# Patient Record
Sex: Male | Born: 1955
Health system: Southern US, Community
[De-identification: ages and names within clinical notes are randomized; demographics above are authoritative.]

## PROBLEM LIST (undated history)

## (undated) DIAGNOSIS — E119 Type 2 diabetes mellitus without complications: Secondary | ICD-10-CM

## (undated) HISTORY — PX: TONSILLECTOMY: SUR1361

## (undated) HISTORY — PX: CATARACT EXTRACTION: SUR2

## (undated) HISTORY — DX: Type 2 diabetes mellitus without complications: E11.9

---

## 1999-04-23 ENCOUNTER — Encounter: Admission: RE | Admit: 1999-04-23 | Discharge: 1999-07-22 | Payer: Self-pay | Admitting: Unknown Physician Specialty

## 2004-07-22 ENCOUNTER — Ambulatory Visit: Payer: Self-pay | Admitting: Endocrinology

## 2009-03-31 HISTORY — PX: SHOULDER ARTHROSCOPY: SHX128

## 2015-02-28 ENCOUNTER — Encounter: Payer: Self-pay | Admitting: Cardiology

## 2015-03-29 ENCOUNTER — Ambulatory Visit (INDEPENDENT_AMBULATORY_CARE_PROVIDER_SITE_OTHER): Payer: 59 | Admitting: Family Medicine

## 2015-03-29 VITALS — BP 108/70 | HR 75 | Temp 98.5°F | Resp 18 | Ht 76.0 in | Wt 255.0 lb

## 2015-03-29 DIAGNOSIS — E785 Hyperlipidemia, unspecified: Secondary | ICD-10-CM | POA: Diagnosis not present

## 2015-03-29 DIAGNOSIS — E119 Type 2 diabetes mellitus without complications: Secondary | ICD-10-CM | POA: Diagnosis not present

## 2015-03-29 DIAGNOSIS — I152 Hypertension secondary to endocrine disorders: Secondary | ICD-10-CM | POA: Insufficient documentation

## 2015-03-29 DIAGNOSIS — I10 Essential (primary) hypertension: Secondary | ICD-10-CM | POA: Insufficient documentation

## 2015-03-29 DIAGNOSIS — E1169 Type 2 diabetes mellitus with other specified complication: Secondary | ICD-10-CM | POA: Insufficient documentation

## 2015-03-29 LAB — COMPLETE METABOLIC PANEL WITH GFR
ALK PHOS: 78 U/L (ref 40–115)
ALT: 27 U/L (ref 9–46)
AST: 18 U/L (ref 10–35)
Albumin: 4.7 g/dL (ref 3.6–5.1)
BILIRUBIN TOTAL: 0.9 mg/dL (ref 0.2–1.2)
BUN: 12 mg/dL (ref 7–25)
CALCIUM: 9.9 mg/dL (ref 8.6–10.3)
CO2: 25 mmol/L (ref 20–31)
CREATININE: 0.78 mg/dL (ref 0.70–1.25)
Chloride: 102 mmol/L (ref 98–110)
Glucose, Bld: 98 mg/dL (ref 65–99)
Potassium: 4.1 mmol/L (ref 3.5–5.3)
Sodium: 138 mmol/L (ref 135–146)
TOTAL PROTEIN: 7.5 g/dL (ref 6.1–8.1)

## 2015-03-29 LAB — LIPID PANEL
CHOLESTEROL: 174 mg/dL (ref 125–200)
HDL: 39 mg/dL — AB (ref >=40)
LDL Cholesterol: 102 mg/dL (ref <130)
TRIGLYCERIDES: 164 mg/dL — AB (ref <150)
Total CHOL/HDL Ratio: 4.5 Ratio (ref <=5.0)
VLDL: 33 mg/dL — ABNORMAL HIGH (ref <30)

## 2015-03-29 LAB — POCT GLYCOSYLATED HEMOGLOBIN (HGB A1C): Hemoglobin A1C: 6.9

## 2015-03-29 LAB — GLUCOSE, POCT (MANUAL RESULT ENTRY): POC Glucose: 100 mg/dl — AB (ref 70–99)

## 2015-03-29 MED ORDER — METFORMIN HCL 500 MG PO TABS: 500.0000 mg | ORAL_TABLET | Freq: Two times a day (BID) | ORAL | Status: DC

## 2015-03-29 MED ORDER — QUINAPRIL HCL 20 MG PO TABS: 20.0000 mg | ORAL_TABLET | Freq: Every day | ORAL | Status: DC

## 2015-03-29 MED ORDER — CANAGLIFLOZIN 100 MG PO TABS: 100.0000 mg | ORAL_TABLET | Freq: Every day | ORAL | Status: DC

## 2015-03-29 MED ORDER — GLIMEPIRIDE 4 MG PO TABS: 4.0000 mg | ORAL_TABLET | Freq: Every day | ORAL | Status: DC

## 2015-03-29 MED ORDER — LOVASTATIN 40 MG PO TABS: 40.0000 mg | ORAL_TABLET | Freq: Every day | ORAL | Status: DC

## 2015-03-29 NOTE — Progress Notes (Addendum)
Subjective:    Patient ID: Christian Harrington, male    DOB: [birth year], 59 y.o.   MRN: [id number] By signing my name below, I, Judithe Modest, attest that this documentation has been prepared under the direction and in the presence of Merri Ray, MD. Electronically Signed: Judithe Modest, ER Scribe. 03/29/2015. 5:13 PM.  Chief Complaint  Patient presents with  . Medication Refill    Invokana,     HPI HPI Comments: Christian Harrington is a 59 y.o. male with a past hx of HLD and DM who presents to [facility] reporting for a medication refill. His previous PCP was Dr. Deeann Saint with [facility]. Pt has not been diagnosed with HTN, pt takes an ace inhibitor for kidney protection. He has no complications due to his DM. He has had intermittent low blood pressure sx, but no recent sx because he eats regularly. He was initially diagnosed with DM in 2001. He exercised infrequently but when he does. He quit smoking in 1993, and had a 25 year smoking hx 2.5 PPD. He has never taken insulin.   Dentist: He has not seen a dentist in a couple of years.  DM: Takes amoril 78m, QD.  invocana 1038mQD. Metformin 100066mHLD: Lovistatin 67m37m Aspirin 325, every other day.  Ace Inhibitor: Accupril 20mg14m Patient Active Problem List   Diagnosis Date Noted  . Diabetes mellitus (HCC) [place]29/2016  . HLD (hyperlipidemia) 03/29/2015  . BP (high blood pressure) 03/29/2015   Past Medical History  Diagnosis Date  . Diabetes mellitus [place] complication (HCC) [place] No pertinent past surgical history. No Known Allergies Prior to Admission medications   Medication Sig Start Date End Date Taking? Authorizing Provider  aspirin 325 MG tablet Take 325 mg by mouth every other day.    Yes Historical Provider, MD  canagliflozin (INVOKANA) 100 MG TABS tablet Take 100 mg by mouth. 09/07/14  Yes Historical Provider, MD  Cholecalciferol (VITAMIN D3) 400 units CAPS Take 500 Units by mouth.   Yes  Historical Provider, MD  Cinnamon 500 MG TABS Take 1 tablet by mouth.   Yes Historical Provider, MD  Forskolin POWD by Does not apply route.   Yes Historical Provider, MD  Garcinia Cambogia-Chromium 500-200 MG-MCG TABS Take 1 tablet by mouth.   Yes Historical Provider, MD  glimepiride (AMARYL) 4 MG tablet Take 4 mg by mouth. 11/12/14  Yes Historical Provider, MD  lovastatin (MEVACOR) 40 MG tablet Take 40 mg by mouth. 09/07/14  Yes Historical Provider, MD  metFORMIN (GLUCOPHAGE-XR) 500 MG 24 hr tablet Take 1,000 mg by mouth. 11/12/14  Yes Historical Provider, MD  naproxen (NAPROSYN) 500 MG tablet Take 500 mg by mouth. 09/07/14  Yes Historical Provider, MD  Omega-3 Fatty Acids (FISH OIL) 1000 MG CAPS Take 1 capsule by mouth.   Yes Historical Provider, MD  quinapril (ACCUPRIL) 20 MG tablet Take 20 mg by mouth. 09/07/14  Yes Historical Provider, MD   Social History   Social History  . Marital Status: Married    Spouse Name: N/A  . Number of Children: N/A  . Years of Education: N/A   Occupational History  . Not on file.   Social History Main Topics  . Smoking status: Former Smoker    Quit date: 04/01/1991  . Smokeless tobacco: Not on file  . Alcohol Use: No  . Drug Use: No  . Sexual Activity: Not on file   Other Topics Concern  .  Not on file   Social History Narrative  . No narrative on file    Review of Systems  Constitutional: Negative for fever, chills, fatigue and unexpected weight change.  Eyes: Negative for visual disturbance.  Respiratory: Negative for cough, chest tightness and shortness of breath.   Cardiovascular: Negative for chest pain, palpitations and leg swelling.  Gastrointestinal: Negative for abdominal pain and blood in stool.  Genitourinary: Negative for urgency and frequency.  Neurological: Negative for dizziness, light-headedness and headaches.      Objective:  BP 108/70 mmHg  Pulse 75  Temp(Src) 98.5 F (36.9 C) (Oral)  Resp 18  Ht 6' 4" (1.93 m)  Wt 255  lb (115.667 kg)  BMI 31.05 kg/m2  SpO2 96%  Physical Exam  Constitutional: He is oriented to person, place, and time. He appears well-developed and well-nourished. No distress.  HENT:  Head: Normocephalic and atraumatic.  Eyes: EOM are normal. Pupils are equal, round, and reactive to light.  Neck: Neck supple. No JVD present. Carotid bruit is not present.  Cardiovascular: Normal rate, regular rhythm and normal heart sounds.   No murmur heard. Pulmonary/Chest: Effort normal and breath sounds normal. No respiratory distress. He has no rales.  Musculoskeletal: Normal range of motion. He exhibits no edema.  Neurological: He is alert and oriented to person, place, and time. Coordination normal.  Skin: Skin is warm and dry. He is not diaphoretic.  Microfilament testing normal bilaterally. There is some callus along medial great toes bilaterally.  Psychiatric: He has a normal mood and affect. His behavior is normal.  Nursing note and vitals reviewed.  Results for orders placed or performed in visit on 03/29/15  POCT glucose (manual entry)  Result Value Ref Range   POC Glucose 100 (A) 70 - 99 mg/dl  POCT glycosylated hemoglobin (Hb A1C)  Result Value Ref Range   Hemoglobin A1C 6.9        Assessment & Plan:   Christian Harrington is a 59 y.o. male Type 2 diabetes mellitus without complication, without long-term current use of insulin ([place]) - Plan: Microalbumin, urine, POCT glucose (manual entry), POCT glycosylated hemoglobin (Hb A1C)  -controlled. Discussed change to invokamet, but chose to stay on same meds. Changed to standard metformin as taking BID.   -urine microalbumin.   -dentist appt recommended.   F/u in 3 months.   Hyperlipidemia - Plan: COMPLETE METABOLIC PANEL WITH GFR, Lipid panel  -cont lovastatin, labs pending.   Plan on cpe with me in next 6-9 months. Can coordinate with work health screening to decrease labwork if needed.   Meds ordered this encounter  Medications  .  aspirin 325 MG tablet    Sig: Take 325 mg by mouth every other day.   [name] DISCONTD: canagliflozin (INVOKANA) 100 MG TABS tablet    Sig: Take 100 mg by mouth.  . Cinnamon 500 MG TABS    Sig: Take 1 tablet by mouth.  . Omega-3 Fatty Acids (FISH OIL) 1000 MG CAPS    Sig: Take 1 capsule by mouth.  Sharia Reeve POWD    Sig: by Does not apply route.  . Garcinia Cambogia-Chromium 500-200 MG-MCG TABS    Sig: Take 1 tablet by mouth.  . DISCONTD: glimepiride (AMARYL) 4 MG tablet    Sig: Take 4 mg by mouth.  . DISCONTD: lovastatin (MEVACOR) 40 MG tablet    Sig: Take 40 mg by mouth.  . DISCONTD: metFORMIN (GLUCOPHAGE-XR) 500 MG 24 hr tablet    Sig:  Take 1,000 mg by mouth.  . naproxen (NAPROSYN) 500 MG tablet    Sig: Take 500 mg by mouth.  . DISCONTD: quinapril (ACCUPRIL) 20 MG tablet    Sig: Take 20 mg by mouth.  . Cholecalciferol (VITAMIN D3) 400 units CAPS    Sig: Take 500 Units by mouth.  [name] glimepiride (AMARYL) 4 MG tablet    Sig: Take 1 tablet (4 mg total) by mouth daily with breakfast.    Dispense:  90 tablet    Refill:  1  . quinapril (ACCUPRIL) 20 MG tablet    Sig: Take 1 tablet (20 mg total) by mouth daily.    Dispense:  90 tablet    Refill:  1  . lovastatin (MEVACOR) 40 MG tablet    Sig: Take 1 tablet (40 mg total) by mouth at bedtime.    Dispense:  90 tablet    Refill:  1  . canagliflozin (INVOKANA) 100 MG TABS tablet    Sig: Take 1 tablet (100 mg total) by mouth daily before breakfast.    Dispense:  90 tablet    Refill:  1  . metFORMIN (GLUCOPHAGE) 500 MG tablet    Sig: Take 1 tablet (500 mg total) by mouth 2 (two) times daily with a meal.    Dispense:  180 tablet    Refill:  1   Patient Instructions  You should receive a call or letter about your lab results within the next week to 10 days.   Follow up in 3 months and see me around the time of your work health screen for physical.   Diabetes and Standards of Medical Care Diabetes is complicated. You may find that your  diabetes team includes a dietitian, nurse, diabetes educator, eye doctor, and more. To help everyone know what is going on and to help you get the care you deserve, the following schedule of care was developed to help keep you on track. Below are the tests, exams, vaccines, medicines, education, and plans you will need. HbA1c test This test shows how well you have controlled your glucose over the past 2-3 months. It is used to see if your diabetes management plan needs to be adjusted.   It is performed at least 2 times a year if you are meeting treatment goals.  It is performed 4 times a year if therapy has changed or if you are not meeting treatment goals. Blood pressure test  This test is performed at every routine medical visit. The goal is less than 140/90 mm Hg for most people, but 130/80 mm Hg in some cases. Ask your health care provider about your goal. Dental exam  Follow up with the dentist regularly. Eye exam  If you are diagnosed with type 1 diabetes as a child, get an exam upon reaching the age of 81 years or older and having had diabetes for 3-5 years. Yearly eye exams are recommended after that initial eye exam.  If you are diagnosed with type 1 diabetes as an adult, get an exam within 5 years of diagnosis and then yearly.  If you are diagnosed with type 2 diabetes, get an exam as soon as possible after the diagnosis and then yearly. Foot care exam  Visual foot exams are performed at every routine medical visit. The exams check for cuts, injuries, or other problems with the feet.  You should have a complete foot exam performed every year. This exam includes an inspection of the structure and skin of your  feet, a check of the pulses in your feet, and a check of the sensation in your feet.  Type 1 diabetes: The first exam is performed 5 years after diagnosis.  Type 2 diabetes: The first exam is performed at the time of diagnosis.  Check your feet nightly for cuts, injuries,  or other problems with your feet. Tell your health care provider if anything is not healing. Kidney function test (urine microalbumin)  This test is performed once a year.  Type 1 diabetes: The first test is performed 5 years after diagnosis.  Type 2 diabetes: The first test is performed at the time of diagnosis.  A serum creatinine and estimated glomerular filtration rate (eGFR) test is done once a year to assess the level of chronic kidney disease (CKD), if present. Lipid profile (cholesterol, HDL, LDL, triglycerides)  Performed every 5 years for most people.  The goal for LDL is less than 100 mg/dL. If you are at high risk, the goal is less than 70 mg/dL.  The goal for HDL is 40 mg/dL-50 mg/dL for men and 50 mg/dL-60 mg/dL for women. An HDL cholesterol of 60 mg/dL or higher gives some protection against heart disease.  The goal for triglycerides is less than 150 mg/dL. Immunizations  The flu (influenza) vaccine is recommended yearly for every person 78 months of age or older who has diabetes.  The pneumonia (pneumococcal) vaccine is recommended for every person 58 years of age or older who has diabetes. Adults 79 years of age or older may receive the pneumonia vaccine as a series of two separate shots.  The hepatitis B vaccine is recommended for adults shortly after they have been diagnosed with diabetes.  The Tdap (tetanus, diphtheria, and pertussis) vaccine should be given:  According to normal childhood vaccination schedules, for children.  Every 10 years, for adults who have diabetes. Diabetes self-management education  Education is recommended at diagnosis and ongoing as needed. Treatment plan  Your treatment plan is reviewed at every medical visit.   This information is not intended to replace advice given to you by your health care provider. Make sure you discuss any questions you have with your health care provider.   Document Released: 01/12/2009 Document Revised:  04/07/2014 Document Reviewed: 08/17/2012 Elsevier Interactive Patient Education [employer].       I personally performed the services described in this documentation, which was scribed in my presence. The recorded information has been reviewed and considered, and addended by me as needed.

## 2015-03-29 NOTE — Patient Instructions (Signed)
You should receive a call or letter about your lab results within the next week to 10 days.   Follow up in 3 months and see me around the time of your work health screen for physical.   Diabetes and Standards of Medical Care Diabetes is complicated. You may find that your diabetes team includes a dietitian, nurse, diabetes educator, eye doctor, and more. To help everyone know what is going on and to help you get the care you deserve, the following schedule of care was developed to help keep you on track. Below are the tests, exams, vaccines, medicines, education, and plans you will need. HbA1c test This test shows how well you have controlled your glucose over the past 2-3 months. It is used to see if your diabetes management plan needs to be adjusted.   It is performed at least 2 times a year if you are meeting treatment goals.  It is performed 4 times a year if therapy has changed or if you are not meeting treatment goals. Blood pressure test  This test is performed at every routine medical visit. The goal is less than 140/90 mm Hg for most people, but 130/80 mm Hg in some cases. Ask your health care provider about your goal. Dental exam  Follow up with the dentist regularly. Eye exam  If you are diagnosed with type 1 diabetes as a child, get an exam upon reaching the age of 67 years or older and having had diabetes for 3-5 years. Yearly eye exams are recommended after that initial eye exam.  If you are diagnosed with type 1 diabetes as an adult, get an exam within 5 years of diagnosis and then yearly.  If you are diagnosed with type 2 diabetes, get an exam as soon as possible after the diagnosis and then yearly. Foot care exam  Visual foot exams are performed at every routine medical visit. The exams check for cuts, injuries, or other problems with the feet.  You should have a complete foot exam performed every year. This exam includes an inspection of the structure and skin of your  feet, a check of the pulses in your feet, and a check of the sensation in your feet.  Type 1 diabetes: The first exam is performed 5 years after diagnosis.  Type 2 diabetes: The first exam is performed at the time of diagnosis.  Check your feet nightly for cuts, injuries, or other problems with your feet. Tell your health care provider if anything is not healing. Kidney function test (urine microalbumin)  This test is performed once a year.  Type 1 diabetes: The first test is performed 5 years after diagnosis.  Type 2 diabetes: The first test is performed at the time of diagnosis.  A serum creatinine and estimated glomerular filtration rate (eGFR) test is done once a year to assess the level of chronic kidney disease (CKD), if present. Lipid profile (cholesterol, HDL, LDL, triglycerides)  Performed every 5 years for most people.  The goal for LDL is less than 100 mg/dL. If you are at high risk, the goal is less than 70 mg/dL.  The goal for HDL is 40 mg/dL-50 mg/dL for men and 50 mg/dL-60 mg/dL for women. An HDL cholesterol of 60 mg/dL or higher gives some protection against heart disease.  The goal for triglycerides is less than 150 mg/dL. Immunizations  The flu (influenza) vaccine is recommended yearly for every person 17 months of age or older who has diabetes.  The  pneumonia (pneumococcal) vaccine is recommended for every person 55 years of age or older who has diabetes. Adults 103 years of age or older may receive the pneumonia vaccine as a series of two separate shots.  The hepatitis B vaccine is recommended for adults shortly after they have been diagnosed with diabetes.  The Tdap (tetanus, diphtheria, and pertussis) vaccine should be given:  According to normal childhood vaccination schedules, for children.  Every 10 years, for adults who have diabetes. Diabetes self-management education  Education is recommended at diagnosis and ongoing as needed. Treatment plan  Your  treatment plan is reviewed at every medical visit.   This information is not intended to replace advice given to you by your health care provider. Make sure you discuss any questions you have with your health care provider.   Document Released: 01/12/2009 Document Revised: 04/07/2014 Document Reviewed: 08/17/2012 Elsevier Interactive Patient Education [employer].

## 2015-03-30 LAB — MICROALBUMIN, URINE: Microalb, Ur: 4.7 mg/dL

## 2015-07-21 ENCOUNTER — Ambulatory Visit (INDEPENDENT_AMBULATORY_CARE_PROVIDER_SITE_OTHER): Payer: 59 | Admitting: Physician Assistant

## 2015-07-21 VITALS — BP 128/70 | HR 76 | Temp 97.8°F | Resp 16 | Ht 76.0 in | Wt 253.6 lb

## 2015-07-21 DIAGNOSIS — E119 Type 2 diabetes mellitus without complications: Secondary | ICD-10-CM | POA: Diagnosis not present

## 2015-07-21 LAB — POCT GLYCOSYLATED HEMOGLOBIN (HGB A1C): HEMOGLOBIN A1C: 6.8

## 2015-07-21 LAB — GLUCOSE, POCT (MANUAL RESULT ENTRY): POC GLUCOSE: 87 mg/dL (ref 70–99)

## 2015-07-21 MED ORDER — QUINAPRIL HCL 20 MG PO TABS: 20.0000 mg | ORAL_TABLET | Freq: Every day | ORAL | Status: DC

## 2015-07-21 MED ORDER — LOVASTATIN 40 MG PO TABS: 40.0000 mg | ORAL_TABLET | Freq: Every day | ORAL | Status: DC

## 2015-07-21 MED ORDER — GLUCOSE BLOOD VI STRP: ORAL_STRIP | Status: DC

## 2015-07-21 MED ORDER — METFORMIN HCL 500 MG PO TABS: 1000.0000 mg | ORAL_TABLET | Freq: Two times a day (BID) | ORAL | Status: DC

## 2015-07-21 MED ORDER — CANAGLIFLOZIN 100 MG PO TABS: 100.0000 mg | ORAL_TABLET | Freq: Every day | ORAL | Status: DC

## 2015-07-21 NOTE — Progress Notes (Signed)
Patient ID: Christian Harrington, male    DOB: 10/07/1955, 60 y.o.   MRN: [id number]  PCP: No primary care provider on file.  Subjective:   Chief Complaint  Patient presents with  . Medication Refill    Inovokana,glimepride,lovastatin,metformin,quinapril    HPI Presents for evaluation of diabetes.  Last visit was 03/2015. Labs at that time included lipids and urine microalbumin.  Notes that at his last visit, the plan was to change the XR metformin to the IR, but that did not happen. Review of the chart indicates that he did get the immediate release product.  Previously on metformin 500 mg BID and Actos. When Actos was stopped, the metformin was increased to 1000 mg and glimepiride was added. He's hopeful to stop the glimepiride, and continue 500 mg BID.  Has not seen an eye specialist in the past 12 months. He became upset by the bills he kept receiving from the office.   Review of Systems  Constitutional: Negative.   Eyes: Negative for visual disturbance.  Respiratory: Negative for chest tightness and shortness of breath.   Cardiovascular: Negative for chest pain, palpitations and leg swelling.  Gastrointestinal: Negative for nausea, vomiting, diarrhea and constipation.  Endocrine: Negative for cold intolerance, heat intolerance, polydipsia, polyphagia and polyuria.  Genitourinary: Negative for dysuria, urgency, frequency and hematuria.  Musculoskeletal: Positive for arthralgias (LEFT shoulder). Negative for myalgias and joint swelling.  Skin: Negative for rash.  Neurological: Negative for dizziness, weakness and headaches.  Hematological: Negative for adenopathy.       Patient Active Problem List   Diagnosis Date Noted  . Diabetes mellitus (HCC) 03/29/2015  . HLD (hyperlipidemia) 03/29/2015  . BP (high blood pressure) 03/29/2015     Prior to Admission medications   Medication Sig Start Date End Date Taking? Authorizing Provider  aspirin 325 MG tablet Take 325 mg  by mouth every other day.    Yes Historical Provider, MD  canagliflozin (INVOKANA) 100 MG TABS tablet Take 1 tablet (100 mg total) by mouth daily before breakfast. 03/29/15  Yes Shade FloodJeffrey R Greene, MD  Cholecalciferol (VITAMIN D3) 400 units CAPS Take 500 Units by mouth.   Yes Historical Provider, MD  Cinnamon 500 MG TABS Take 1 tablet by mouth.   Yes Historical Provider, MD  Forskolin POWD by Does not apply route.   Yes Historical Provider, MD  Garcinia Cambogia-Chromium 500-200 MG-MCG TABS Take 1 tablet by mouth.   Yes Historical Provider, MD  glimepiride (AMARYL) 4 MG tablet Take 1 tablet (4 mg total) by mouth daily with breakfast. 03/29/15  Yes Shade FloodJeffrey R Greene, MD  lovastatin (MEVACOR) 40 MG tablet Take 1 tablet (40 mg total) by mouth at bedtime. 03/29/15  Yes Shade FloodJeffrey R Greene, MD  metFORMIN (GLUCOPHAGE) 500 MG tablet Take 1 tablet (500 mg total) by mouth 2 (two) times daily with a meal. 03/29/15  Yes Shade FloodJeffrey R Greene, MD  naproxen (NAPROSYN) 500 MG tablet Take 500 mg by mouth. 09/07/14  Yes Historical Provider, MD  Omega-3 Fatty Acids (FISH OIL) 1000 MG CAPS Take 1 capsule by mouth.   Yes Historical Provider, MD  quinapril (ACCUPRIL) 20 MG tablet Take 1 tablet (20 mg total) by mouth daily. 03/29/15  Yes Shade FloodJeffrey R Greene, MD     No Known Allergies     Objective:  Physical Exam  Constitutional: He is oriented to person, place, and time. He appears well-developed and well-nourished. He is active and cooperative. No distress.  BP 128/70 mmHg  Pulse 76  Temp(Src) 97.8 F (36.6 C) (Oral)  Resp 16  Ht  (1.93 m)  Wt 253 lb 9.6 oz (115.032 kg)  BMI 30.88 kg/m2  SpO2 97%  HENT:  Head: Normocephalic and atraumatic.  Right Ear: Hearing normal.  Left Ear: Hearing normal.  Eyes: Conjunctivae are normal. No scleral icterus.  Neck: Normal range of motion, full passive range of motion without pain and phonation normal. Neck supple. No thyromegaly present.  Cardiovascular: Normal rate,  regular rhythm and normal heart sounds.   Pulses:      Radial pulses are 2+ on the right side, and 2+ on the left side.  Pulmonary/Chest: Effort normal and breath sounds normal.  Lymphadenopathy:       Head (right side): No tonsillar, no preauricular, no posterior auricular and no occipital adenopathy present.       Head (left side): No tonsillar, no preauricular, no posterior auricular and no occipital adenopathy present.    He has no cervical adenopathy.       Right: No supraclavicular adenopathy present.       Left: No supraclavicular adenopathy present.  Neurological: He is alert and oriented to person, place, and time. No sensory deficit.  Skin: Skin is warm, dry and intact. No rash noted. No cyanosis or erythema. Nails show no clubbing.  Psychiatric: He has a normal mood and affect. His speech is normal and behavior is normal.     Results for orders placed or performed in visit on 07/21/15  POCT glucose (manual entry)  Result Value Ref Range   POC Glucose 87 70 - 99 mg/dl  POCT glycosylated hemoglobin (Hb A1C)  Result Value Ref Range   Hemoglobin A1C 6.8          Assessment & Plan:   1. Type 2 diabetes mellitus without complication, without long-term current use of insulin (HCC) I recommend D/C glimepiride and increase metformin to 1000 mg BID. He indicates that he will stop the glimepiride, but continue on the lower dose of metformin. He is interested in finding out if [place] or [place] are lower cost for him. Request his records from previous PCP, to identify which of his outstanding health maintenance items truly need updating (he says that he has had "most of those"). - POCT glucose (manual entry) - POCT glycosylated hemoglobin (Hb A1C) - Comprehensive metabolic panel - quinapril (ACCUPRIL) 20 MG tablet; Take 1 tablet (20 mg total) by mouth daily.  Dispense: 90 tablet; Refill: 3 - metFORMIN (GLUCOPHAGE) 500 MG tablet; Take 2 tablets (1,000 mg total) by mouth 2 (two)  times daily with a meal.  Dispense: 360 tablet; Refill: 3 - lovastatin (MEVACOR) 40 MG tablet; Take 1 tablet (40 mg total) by mouth at bedtime.  Dispense: 90 tablet; Refill: 3 - canagliflozin (INVOKANA) 100 MG TABS tablet; Take 1 tablet (100 mg total) by mouth daily before breakfast.  Dispense: 90 tablet; Refill: 3 - glucose blood test strip; Use as instructed  Dispense: 100 each; Refill: 12   Fernande Bras, PA-C Physician Assistant-Certified [facility]

## 2015-07-21 NOTE — Patient Instructions (Addendum)
Other medications like Invokana: Jardiance (Boe[employer]ringer [employer]gel[employer]) English as a second language teacherarxiga (Astra[employer]en[employer])  I recommend that you increase the metformin to 1000 mg twice each day, as I am concerned that the A1C will rise above 7% with the elimination of the glimepiride.  I have provided refills of the medications to last for a year. However, I recommend that you return for re-evaluation in 3-4 months.  I recommend that at your next visit, you have a complete physical and address a number of outstanding health maintenance items, including vaccines and additional recommended screening lab testing.   IF you received an x-ray today, you will receive an invoice from [facility]. Please contact [facility] at [phone] with questions or concerns regarding your invoice.   IF you received labwork today, you will receive an invoice from [facility]. Please contact [facility] at [phone] with questions or concerns regarding your invoice.   Our billing staff will not be able to assist you with questions regarding bills from these companies.  You will be contacted with the lab results as soon as they are available. The fastest way to get your results is to activate your My Chart account. Instructions are located on the last page of this paperwork. If you have not heard from us regarding the results in 2 weeks, please contact this office.

## 2015-07-22 ENCOUNTER — Encounter: Payer: Self-pay | Admitting: Physician Assistant

## 2015-07-22 LAB — COMPREHENSIVE METABOLIC PANEL
ALT: 20 U/L (ref 9–46)
AST: 15 U/L (ref 10–35)
Albumin: 4.2 g/dL (ref 3.6–5.1)
Alkaline Phosphatase: 72 U/L (ref 40–115)
BUN: 14 mg/dL (ref 7–25)
CHLORIDE: 103 mmol/L (ref 98–110)
CO2: 27 mmol/L (ref 20–31)
CREATININE: 0.86 mg/dL (ref 0.70–1.25)
Calcium: 9.6 mg/dL (ref 8.6–10.3)
GLUCOSE: 77 mg/dL (ref 65–99)
POTASSIUM: 4.5 mmol/L (ref 3.5–5.3)
SODIUM: 137 mmol/L (ref 135–146)
TOTAL PROTEIN: 7.2 g/dL (ref 6.1–8.1)
Total Bilirubin: 0.5 mg/dL (ref 0.2–1.2)

## 2015-10-02 ENCOUNTER — Encounter: Payer: Self-pay | Admitting: Physician Assistant

## 2016-04-01 DIAGNOSIS — S43432D Superior glenoid labrum lesion of left shoulder, subsequent encounter: Secondary | ICD-10-CM | POA: Diagnosis not present

## 2016-04-03 DIAGNOSIS — S43432D Superior glenoid labrum lesion of left shoulder, subsequent encounter: Secondary | ICD-10-CM | POA: Diagnosis not present

## 2016-04-08 ENCOUNTER — Telehealth: Payer: Self-pay

## 2016-04-08 DIAGNOSIS — S43432D Superior glenoid labrum lesion of left shoulder, subsequent encounter: Secondary | ICD-10-CM | POA: Diagnosis not present

## 2016-04-08 NOTE — Telephone Encounter (Signed)
invokana is back ordered/not available  can pharmacy sub this med for patient?

## 2016-04-08 NOTE — Telephone Encounter (Signed)
Yes. Sub FARXIGA 5 mg 1 PO QD #90

## 2016-04-09 MED ORDER — DAPAGLIFLOZIN PROPANEDIOL 5 MG PO TABS: 5.0000 mg | ORAL_TABLET | Freq: Every day | ORAL | 0 refills | Status: DC

## 2016-04-09 NOTE — Telephone Encounter (Signed)
done

## 2016-04-15 DIAGNOSIS — S43432D Superior glenoid labrum lesion of left shoulder, subsequent encounter: Secondary | ICD-10-CM | POA: Diagnosis not present

## 2016-04-22 DIAGNOSIS — S43432D Superior glenoid labrum lesion of left shoulder, subsequent encounter: Secondary | ICD-10-CM | POA: Diagnosis not present

## 2016-04-24 DIAGNOSIS — S43432D Superior glenoid labrum lesion of left shoulder, subsequent encounter: Secondary | ICD-10-CM | POA: Diagnosis not present

## 2016-04-29 DIAGNOSIS — S43432D Superior glenoid labrum lesion of left shoulder, subsequent encounter: Secondary | ICD-10-CM | POA: Diagnosis not present

## 2016-05-01 DIAGNOSIS — S43432D Superior glenoid labrum lesion of left shoulder, subsequent encounter: Secondary | ICD-10-CM | POA: Diagnosis not present

## 2016-05-06 DIAGNOSIS — S43432D Superior glenoid labrum lesion of left shoulder, subsequent encounter: Secondary | ICD-10-CM | POA: Diagnosis not present

## 2016-05-09 DIAGNOSIS — S43432D Superior glenoid labrum lesion of left shoulder, subsequent encounter: Secondary | ICD-10-CM | POA: Diagnosis not present

## 2016-05-12 DIAGNOSIS — S43432D Superior glenoid labrum lesion of left shoulder, subsequent encounter: Secondary | ICD-10-CM | POA: Diagnosis not present

## 2016-05-13 DIAGNOSIS — S43432D Superior glenoid labrum lesion of left shoulder, subsequent encounter: Secondary | ICD-10-CM | POA: Diagnosis not present

## 2016-05-15 DIAGNOSIS — S43432D Superior glenoid labrum lesion of left shoulder, subsequent encounter: Secondary | ICD-10-CM | POA: Diagnosis not present

## 2016-05-20 DIAGNOSIS — S43432D Superior glenoid labrum lesion of left shoulder, subsequent encounter: Secondary | ICD-10-CM | POA: Diagnosis not present

## 2016-05-22 DIAGNOSIS — S43432D Superior glenoid labrum lesion of left shoulder, subsequent encounter: Secondary | ICD-10-CM | POA: Diagnosis not present

## 2016-05-27 DIAGNOSIS — S43432D Superior glenoid labrum lesion of left shoulder, subsequent encounter: Secondary | ICD-10-CM | POA: Diagnosis not present

## 2016-05-29 DIAGNOSIS — S43432D Superior glenoid labrum lesion of left shoulder, subsequent encounter: Secondary | ICD-10-CM | POA: Diagnosis not present

## 2016-06-03 DIAGNOSIS — S43432D Superior glenoid labrum lesion of left shoulder, subsequent encounter: Secondary | ICD-10-CM | POA: Diagnosis not present

## 2016-06-05 DIAGNOSIS — S43432D Superior glenoid labrum lesion of left shoulder, subsequent encounter: Secondary | ICD-10-CM | POA: Diagnosis not present

## 2016-06-11 ENCOUNTER — Ambulatory Visit (INDEPENDENT_AMBULATORY_CARE_PROVIDER_SITE_OTHER): Payer: 59 | Admitting: Family Medicine

## 2016-06-11 ENCOUNTER — Telehealth: Payer: Self-pay | Admitting: *Deleted

## 2016-06-11 VITALS — BP 122/72 | HR 94 | Temp 98.2°F | Resp 17 | Ht 74.5 in | Wt 251.0 lb

## 2016-06-11 DIAGNOSIS — E119 Type 2 diabetes mellitus without complications: Secondary | ICD-10-CM | POA: Diagnosis not present

## 2016-06-11 LAB — POCT URINALYSIS DIP (MANUAL ENTRY)
BILIRUBIN UA: NEGATIVE
BILIRUBIN UA: NEGATIVE
Leukocytes, UA: NEGATIVE
NITRITE UA: NEGATIVE
PH UA: 5.5
Protein Ur, POC: NEGATIVE
RBC UA: NEGATIVE
Spec Grav, UA: 1.015
Urobilinogen, UA: 0.2

## 2016-06-11 LAB — POCT GLYCOSYLATED HEMOGLOBIN (HGB A1C): HEMOGLOBIN A1C: 8.5

## 2016-06-11 MED ORDER — CANAGLIFLOZIN 100 MG PO TABS: 100.0000 mg | ORAL_TABLET | Freq: Every day | ORAL | 3 refills | Status: DC

## 2016-06-11 MED ORDER — METFORMIN HCL 500 MG PO TABS: 1000.0000 mg | ORAL_TABLET | Freq: Two times a day (BID) | ORAL | 3 refills | Status: DC

## 2016-06-11 MED ORDER — LOVASTATIN 40 MG PO TABS: 40.0000 mg | ORAL_TABLET | Freq: Every day | ORAL | 3 refills | Status: DC

## 2016-06-11 MED ORDER — QUINAPRIL HCL 20 MG PO TABS: 20.0000 mg | ORAL_TABLET | Freq: Every day | ORAL | 3 refills | Status: DC

## 2016-06-11 MED ORDER — BLOOD GLUCOSE MONITOR KIT: PACK | 0 refills | Status: AC

## 2016-06-11 NOTE — Telephone Encounter (Signed)
Faxed Rx for glucose meter kit and supplies Kit, per Dr Mingo Amber. Confirmation page received at 4:49 PM.

## 2016-06-11 NOTE — Patient Instructions (Addendum)
  Your A1C today was 8.5.    We'll switch you back to the Invokana.  Check your blood sugars over the next month.  If they start to go back into normal this is good news.  If not we're going to need to look at doing something else.  You can expect to see the rest of your lab results this week. Your urine test did show some sugar but this is not to be unexpected. Otherwise it was good.  It was good to meet you today   IF you received an x-ray today, you will receive an invoice from [facility]. Please contact [facility] at [phone] with questions or concerns regarding your invoice.   IF you received labwork today, you will receive an invoice from [facility]. Please contact [facility] at [phone] with questions or concerns regarding your invoice.   Our billing staff will not be able to assist you with questions regarding bills from these companies.  You will be contacted with the lab results as soon as they are available. The fastest way to get your results is to activate your My Chart account. Instructions are located on the last page of this paperwork. If you have not heard from us regarding the results in 2 weeks, please contact this office.

## 2016-06-11 NOTE — Progress Notes (Signed)
Christian Harrington is a 61 y.o. male who presents to Primary Care at [facility] today for FU for DM2:  1. Diabetes:  Currently on Metformin and Farxiga.  He was previously on Invokana but his pharmacy had to substitute [place] back in January.   While on the metformin and Invokana, he states that his blood sugars run in the 100 to 130 range. Since starting the farxiga his blood sugars have been 200-300. Of note he did have a cortisone shot February 12 noted increase in his blood sugars after that.   No adverse effects from medication.  No hypoglycemic events.  No paresthesia or peripheral nerve pain.  Measures blood sugars at home every day.  Has had some increased polydipsia without nocturia since increase in CBGs Lab Results  Component Value Date   HGBA1C 6.8 07/21/2015   2.   HLD:  Last lipid panel listed below.    Currently is on Statin.  Denies any myalgias, icterus, jaundice.  Tolerating medications well.   Lab Results  Component Value Date   CHOL 174 03/29/2015   Lab Results  Component Value Date   HDL 39 (L) 03/29/2015   Lab Results  Component Value Date   LDLCALC 102 03/29/2015   Lab Results  Component Value Date   TRIG 164 (H) 03/29/2015   Lab Results  Component Value Date   CHOLHDL 4.5 03/29/2015    ROS as above.    PMH reviewed. Patient is a nonsmoker.   Past Medical History:  Diagnosis Date  . Diabetes mellitus without complication [facility])    Past Surgical History:  Procedure Laterality Date  . CATARACT EXTRACTION Bilateral   . SHOULDER ARTHROSCOPY Right 2011  . TONSILLECTOMY      Medications reviewed. Current Outpatient Prescriptions  Medication Sig Dispense Refill  . aspirin 325 MG tablet Take 325 mg by mouth every other day.     . canagliflozin (INVOKANA) 100 MG TABS tablet Take 1 tablet (100 mg total) by mouth daily before breakfast. 90 tablet 3  . Cholecalciferol (VITAMIN D3) 400 units CAPS Take 500 Units by mouth.    . Cinnamon 500 MG TABS Take 1 tablet  by mouth.    . dapagliflozin propanediol (FARXIGA) 5 MG TABS tablet Take 5 mg by mouth daily. 90 tablet 0  . Forskolin POWD by Does not apply route.    . Garcinia Cambogia-Chromium 500-200 MG-MCG TABS Take 1 tablet by mouth.    [name] glucose blood test strip Use as instructed 100 each 12  . lovastatin (MEVACOR) 40 MG tablet Take 1 tablet (40 mg total) by mouth at bedtime. 90 tablet 3  . metFORMIN (GLUCOPHAGE) 500 MG tablet Take 2 tablets (1,000 mg total) by mouth 2 (two) times daily with a meal. 360 tablet 3  . naproxen (NAPROSYN) 500 MG tablet Take 500 mg by mouth.    . Omega-3 Fatty Acids (FISH OIL) 1000 MG CAPS Take 1 capsule by mouth.    . quinapril (ACCUPRIL) 20 MG tablet Take 1 tablet (20 mg total) by mouth daily. 90 tablet 3   No current facility-administered medications for this visit.      Physical Exam:  BP 122/72   Pulse 94   Temp 98.2 F (36.8 C) (Oral)   Resp 17   Ht 6' 2.5" (1.892 m)   Wt 251 lb (113.9 kg)   SpO2 97%   BMI 31.80 kg/m  Gen:  Alert, cooperative patient who appears stated age in no acute distress.  Vital signs reviewed. Head: [place]/AT.   Eyes:  EOMI, PERRL.   Nose:  Septum midline  Mouth:  MMM, tonsils non-erythematous, non-edematous.   Pulm:  Clear to auscultation bilaterally with good air movement.  No wheezes or rales noted.   Cardiac:  Regular rate and rhythm without murmur auscultated.  Good S1/S2. Abd:  Soft/nondistended/nontender.   Exts: Non edematous BL  LE, warm and well perfused.  Neuro:  Awake and alert.  No focal deficits.    Results for orders placed or performed in visit on 06/11/16  POCT glycosylated hemoglobin (Hb A1C)  Result Value Ref Range   Hemoglobin A1C 8.5   POCT urinalysis dipstick  Result Value Ref Range   Color, UA yellow yellow   Clarity, UA clear clear   Glucose, UA =500 (A) negative   Bilirubin, UA negative negative   Ketones, POC UA negative negative   Spec Grav, UA 1.015 1.003, 1.005, 1.010, 1.015, 1.020, 1.025,  1.030, 1.035   Blood, UA negative negative   pH, UA 5.5 5.0, 5.5, 6.0, 6.5, 7.0, 7.5, 8.0   Protein Ur, POC negative negative   Urobilinogen, UA 0.2 0.2, 1.0, negative   Nitrite, UA Negative Negative   Leukocytes, UA Negative Negative    Assessment and Plan:  1.  DM2: - worse since switch to [place] - states he's been watching what he eats as well as trying to stay active - with only polyuria.  Otherwise no other symptoms of hyperglycemia. - switch back to Invokana.  FU in 1 month to assess for improvement on this regimen.  - If CBGs remain high, recommend switching to another agent versus starting insulin.   - warning precautions given about hyperglycemia symptoms and need to return to care  2.  HLD:  - on statin - rechecking lipid panel today.

## 2016-06-12 LAB — CBC
Hematocrit: 49.8 % (ref 37.5–51.0)
Hemoglobin: 16 g/dL (ref 13.0–17.7)
MCH: 27.7 pg (ref 26.6–33.0)
MCHC: 32.1 g/dL (ref 31.5–35.7)
MCV: 86 fL (ref 79–97)
PLATELETS: 215 10*3/uL (ref 150–379)
RBC: 5.77 x10E6/uL (ref 4.14–5.80)
RDW: 14.4 % (ref 12.3–15.4)
WBC: 8.3 10*3/uL (ref 3.4–10.8)

## 2016-06-12 LAB — COMPREHENSIVE METABOLIC PANEL
A/G RATIO: 1.8 (ref 1.2–2.2)
ALT: 24 IU/L (ref 0–44)
AST: 13 IU/L (ref 0–40)
Albumin: 4.7 g/dL (ref 3.6–4.8)
Alkaline Phosphatase: 103 IU/L (ref 39–117)
BILIRUBIN TOTAL: 0.5 mg/dL (ref 0.0–1.2)
BUN / CREAT RATIO: 13 (ref 10–24)
BUN: 11 mg/dL (ref 8–27)
CHLORIDE: 99 mmol/L (ref 96–106)
CO2: 26 mmol/L (ref 18–29)
Calcium: 10.1 mg/dL (ref 8.6–10.2)
Creatinine, Ser: 0.85 mg/dL (ref 0.76–1.27)
GFR calc Af Amer: 108 mL/min/{1.73_m2} (ref >59)
GFR calc non Af Amer: 93 mL/min/{1.73_m2} (ref >59)
GLUCOSE: 270 mg/dL — AB (ref 65–99)
Globulin, Total: 2.6 g/dL (ref 1.5–4.5)
POTASSIUM: 4.5 mmol/L (ref 3.5–5.2)
Sodium: 141 mmol/L (ref 134–144)
Total Protein: 7.3 g/dL (ref 6.0–8.5)

## 2016-06-13 ENCOUNTER — Telehealth: Payer: Self-pay | Admitting: Emergency Medicine

## 2016-06-13 NOTE — Telephone Encounter (Signed)
-----   Message from Tobey GrimJeffrey H Walden, MD sent at 06/12/2016  3:53 PM EDT ----- Mr. Ress's labs have all returned.  As he knew, his blood sugar was high.  We are changing his medicines to help with this and should check back with us in about 4 weeks to reassess.  Thanks, JW

## 2016-07-07 DIAGNOSIS — Z4789 Encounter for other orthopedic aftercare: Secondary | ICD-10-CM | POA: Diagnosis not present

## 2017-03-12 DIAGNOSIS — M9902 Segmental and somatic dysfunction of thoracic region: Secondary | ICD-10-CM | POA: Diagnosis not present

## 2017-03-12 DIAGNOSIS — M5386 Other specified dorsopathies, lumbar region: Secondary | ICD-10-CM | POA: Diagnosis not present

## 2017-03-12 DIAGNOSIS — M9903 Segmental and somatic dysfunction of lumbar region: Secondary | ICD-10-CM | POA: Diagnosis not present

## 2017-03-17 DIAGNOSIS — M9902 Segmental and somatic dysfunction of thoracic region: Secondary | ICD-10-CM | POA: Diagnosis not present

## 2017-03-17 DIAGNOSIS — M9903 Segmental and somatic dysfunction of lumbar region: Secondary | ICD-10-CM | POA: Diagnosis not present

## 2017-03-17 DIAGNOSIS — M5386 Other specified dorsopathies, lumbar region: Secondary | ICD-10-CM | POA: Diagnosis not present

## 2017-04-29 ENCOUNTER — Ambulatory Visit: Payer: 59 | Admitting: Physician Assistant

## 2017-04-29 ENCOUNTER — Encounter: Payer: Self-pay | Admitting: Physician Assistant

## 2017-04-29 ENCOUNTER — Other Ambulatory Visit: Payer: Self-pay

## 2017-04-29 ENCOUNTER — Ambulatory Visit (INDEPENDENT_AMBULATORY_CARE_PROVIDER_SITE_OTHER): Payer: 59

## 2017-04-29 VITALS — BP 108/68 | HR 88 | Temp 98.7°F | Resp 18 | Ht 74.5 in | Wt 247.2 lb

## 2017-04-29 DIAGNOSIS — R0789 Other chest pain: Secondary | ICD-10-CM

## 2017-04-29 DIAGNOSIS — E119 Type 2 diabetes mellitus without complications: Secondary | ICD-10-CM | POA: Diagnosis not present

## 2017-04-29 DIAGNOSIS — Z114 Encounter for screening for human immunodeficiency virus [HIV]: Secondary | ICD-10-CM

## 2017-04-29 DIAGNOSIS — I10 Essential (primary) hypertension: Secondary | ICD-10-CM

## 2017-04-29 DIAGNOSIS — E785 Hyperlipidemia, unspecified: Secondary | ICD-10-CM

## 2017-04-29 DIAGNOSIS — R079 Chest pain, unspecified: Secondary | ICD-10-CM | POA: Diagnosis not present

## 2017-04-29 DIAGNOSIS — Z1159 Encounter for screening for other viral diseases: Secondary | ICD-10-CM

## 2017-04-29 NOTE — Progress Notes (Signed)
Subjective:    Patient ID: Christian Harrington, male    DOB: 07-30-1955, 62 y.o.   MRN: [id number]  Chief Complaint  Patient presents with  . Diabetes    Pt states he checks sugars at home. Pt states sugars was 153 yesterday morning before breakfast. Pt states he went to bed at 4 in the morning.  . Follow-up  . Chest Injury    x3 days, Pt states he was at work and turned the wrong way and felt a sharp pain in the right chest.    Presents today for right sided chest pain and diabetes follow-up.  Overall, he feels well.  Takes medications as prescribed, with no adverse effects.  Follows up for diabetes yearly. Cannot afford to come every 3 months.   Last seen for DM on 06/11/16. He was switched back to Invokana at that visit.  04/08/2016, Invokana was on back order at the pharmacy so Wilder Glade was substituted for the patient. Worsened with switch to [place]. Reported blood glucose was 200-300 on Farxiga.   Since switching back to Invokana, glucose levels average around 150 in the morning. Checked yesterday morning - 153. Denies: headache, dizziness, nausea, vomiting, diarrhea, numbness, tingling, decreased sensations, swelling of extremities, polyuria, polyphagia, polydipsia, or difficulty urinating.   Reports right anterior chest pain. Happened 3 nights ago when he turned a funny way. Denies any injury to the area. Quick, sharp pain that came and went. Reoccurred the following night in the same was as the night before. Wife concerned. Did not have the pain yesterday or today. Thinks it is nothing. Former smoker. Quit 1993. Notes he has not had an updated chest x-ray in awhile.   Review of Systems As above  Patient Active Problem List   Diagnosis Date Noted  . Diabetes mellitus ([place]) 03/29/2015  . Hyperlipidemia 03/29/2015  . Hypertension 03/29/2015   Prior to Admission medications   Medication Sig Start Date End Date Taking? Authorizing Provider  aspirin 325 MG tablet Take 325 mg by mouth  every other day.    Yes [provider]  blood glucose meter kit and supplies KIT Dispense per patient and insurance preference. Use up to four times daily FOR ICD-9 250.00, 250.01 06/11/16  Yes Alveda Reasons, MD  canagliflozin [facility]) 100 MG TABS tablet Take 1 tablet (100 mg total) by mouth daily before breakfast. 06/11/16  Yes Alveda Reasons, MD  Cholecalciferol (VITAMIN D3) 400 units CAPS Take 500 Units by mouth.   Yes [provider]  Cinnamon 500 MG TABS Take 1 tablet by mouth.   Yes [provider]  Forskolin POWD by Does not apply route.   Yes [provider]  Garcinia Cambogia-Chromium 500-200 MG-MCG TABS Take 1 tablet by mouth.   Yes [provider]  glucose blood test strip Use as instructed 07/21/15  Yes Jeffery, Chelle, PA-C  lovastatin (MEVACOR) 40 MG tablet Take 1 tablet (40 mg total) by mouth at bedtime. 06/11/16  Yes Alveda Reasons, MD  metFORMIN (GLUCOPHAGE) 500 MG tablet Take 2 tablets (1,000 mg total) by mouth 2 (two) times daily with a meal. 06/11/16  Yes Alveda Reasons, MD  naproxen (NAPROSYN) 500 MG tablet Take 500 mg by mouth. 09/07/14  Yes [provider]  Omega-3 Fatty Acids (FISH OIL) 1000 MG CAPS Take 1 capsule by mouth.   Yes [provider]  quinapril (ACCUPRIL) 20 MG tablet Take 1 tablet (20 mg total) by mouth daily. 06/11/16  Yes  Alveda Reasons, MD   No Known Allergies     Objective:   Physical Exam  Constitutional: He is oriented to person, place, and time. He appears well-developed and well-nourished.  HENT:  Head: Normocephalic.  Neck: No JVD present.  Cardiovascular: Normal rate, regular rhythm, normal heart sounds and intact distal pulses. Exam reveals no gallop and no friction rub.  No murmur heard. Pulmonary/Chest: Effort normal and breath sounds normal. No respiratory distress. He has no wheezes. He has no rales. He exhibits no tenderness, no bony tenderness, no edema, no  deformity and no swelling.  Musculoskeletal: He exhibits no edema.  Lymphadenopathy:    He has no cervical adenopathy.  Neurological: He is alert and oriented to person, place, and time.  Psychiatric: He has a normal mood and affect. His behavior is normal.   Blood pressure 108/68, pulse 88, temperature 98.7 F (37.1 C), temperature source Oral, resp. rate 18, height 6' 2.5" (1.892 m), weight 247 lb 3.2 oz (112.1 kg), SpO2 95 %.     Assessment & Plan:  1. Right-sided chest wall pain Unclear etiology. No pain for over 2 days. No tenderness with palpation. Do not suspect pain is cardiac related. Former smoker. Chest x-ray ordered to rule out lung malignancy.  - DG Chest 2 View; Future  2. Type 2 diabetes mellitus without complication, without long-term current use of insulin (HCC) Previously uncontrolled. Await lab results. Will adjust treatment as indicated by results. Advised to have annual diabetic eye exams. Recommended eye care specialists to the patient.   - Hemoglobin A1c - Comprehensive metabolic panel - Microalbumin / creatinine urine ratio - HM DIABETES EYE EXAM - HM DIABETES FOOT EXAM  3. Hyperlipidemia, unspecified hyperlipidemia type Last lipid panel drawn in 2016 showed Triglycerides and LDL levels borderline elevated. No evidence if levels have been stable since 2016. Await lab results. Adjust treatment as indicated by results.  - Lipid panel - Comprehensive metabolic panel  4. Essential hypertension Controlled. Continue current medications.  - CBC with Differential/Platelet - Comprehensive metabolic panel  5. Need for hepatitis C screening test - Hepatitis C antibody  6. Screening for HIV (human immunodeficiency virus) - HIV antibody  Return in about 3 months (around 07/28/2017) for re-evaluation of diabetes, blood pressure. Plan to have Pneumovax 23 and Tdap vaccines at next visit.   Noemi Chapel, PA-S

## 2017-04-29 NOTE — Progress Notes (Signed)
Patient ID: Christian Harrington, male    DOB: 10-03-1955, 62 y.o.   MRN: [id number]  PCP: Harrison Mons, PA-C  Chief Complaint  Patient presents with  . Diabetes    Pt states he checks sugars at home. Pt states sugars was 153 yesterday morning before breakfast. Pt states he went to bed at 4 in the morning.  . Follow-up  . Chest Injury    x3 days, Pt states he was at work and turned the wrong way and felt a sharp pain in the right chest.     Subjective:   Presents for evaluation of diabetes, and for RIGHT chest wall pain x 3 days.   Last visit was 06/11/2016. Cost prevents more regular follow-up.  Recall that there was a back order on Invokana a year ago, and so he was switched to [place]. His glucose ran 200-300, so he was switched back to [facility] when it was available.  Home glucose reportedly about 150 fasting each morning. Tolerating his treatment well, no increased thirst, urination, vision changes, nausea.  RIGHT sided chest wall pain began 3 days ago when he turned in a "funny" way. The pain was sharp and very brief, lasting only a second, but then recurred the following night. No pain since then. No recalled trauma/injury. He quit smoking in 1993. His wife is worried..    Review of Systems  Constitutional: Negative for activity change, appetite change, fatigue and unexpected weight change.  HENT: Negative for congestion, dental problem, ear pain, hearing loss, mouth sores, postnasal drip, rhinorrhea, sneezing, sore throat, tinnitus and trouble swallowing.   Eyes: Negative for photophobia, pain, redness and visual disturbance.  Respiratory: Negative for cough, chest tightness and shortness of breath.   Cardiovascular: Positive for chest pain (RIGHT sided chest wall, 2 brief episodes). Negative for palpitations and leg swelling.  Gastrointestinal: Negative for abdominal pain, blood in stool, constipation, diarrhea, nausea and vomiting.  Endocrine: Negative for cold  intolerance, heat intolerance, polydipsia, polyphagia and polyuria.  Genitourinary: Negative for dysuria, frequency, hematuria and urgency.  Musculoskeletal: Negative for arthralgias, gait problem, myalgias and neck stiffness.  Skin: Negative for rash.  Neurological: Negative for dizziness, speech difficulty, weakness, light-headedness, numbness and headaches.  Hematological: Negative for adenopathy.  Psychiatric/Behavioral: Negative for confusion and sleep disturbance. The patient is not nervous/anxious.        Patient Active Problem List   Diagnosis Date Noted  . Diabetes mellitus ([place]) 03/29/2015  . HLD (hyperlipidemia) 03/29/2015  . BP (high blood pressure) 03/29/2015     Prior to Admission medications   Medication Sig Start Date End Date Taking? Authorizing Provider  aspirin 325 MG tablet Take 325 mg by mouth every other day.    Yes [provider]  blood glucose meter kit and supplies KIT Dispense per patient and insurance preference. Use up to four times daily FOR ICD-9 250.00, 250.01 06/11/16  Yes Alveda Reasons, MD  canagliflozin [facility]) 100 MG TABS tablet Take 1 tablet (100 mg total) by mouth daily before breakfast. 06/11/16  Yes Alveda Reasons, MD  Cholecalciferol (VITAMIN D3) 400 units CAPS Take 500 Units by mouth.   Yes [provider]  Cinnamon 500 MG TABS Take 1 tablet by mouth.   Yes [provider]  Forskolin POWD by Does not apply route.   Yes [provider]  Garcinia Cambogia-Chromium 500-200 MG-MCG TABS Take 1 tablet by mouth.   Yes [provider]  glucose blood test strip Use  as instructed 07/21/15  Yes Broedy Osbourne, PA-C  lovastatin (MEVACOR) 40 MG tablet Take 1 tablet (40 mg total) by mouth at bedtime. 06/11/16  Yes Alveda Reasons, MD  metFORMIN (GLUCOPHAGE) 500 MG tablet Take 2 tablets (1,000 mg total) by mouth 2 (two) times daily with a meal. 06/11/16  Yes Alveda Reasons, MD  naproxen (NAPROSYN) 500  MG tablet Take 500 mg by mouth. 09/07/14  Yes [provider]  Omega-3 Fatty Acids (FISH OIL) 1000 MG CAPS Take 1 capsule by mouth.   Yes [provider]  quinapril (ACCUPRIL) 20 MG tablet Take 1 tablet (20 mg total) by mouth daily. 06/11/16  Yes Alveda Reasons, MD  dapagliflozin propanediol (FARXIGA) 5 MG TABS tablet Take 5 mg by mouth daily. Patient not taking: Reported on 04/29/2017 04/09/16   Harrison Mons, PA-C     No Known Allergies     Objective:  Physical Exam  Constitutional: He is oriented to person, place, and time. He appears well-developed and well-nourished. He is active and cooperative. No distress.  BP 108/68 (BP Location: Right Arm, Patient Position: Sitting, Cuff Size: Large)   Pulse 88   Temp 98.7 F (37.1 C) (Oral)   Resp 18   Ht 6' 2.5" (1.892 m)   Wt 247 lb 3.2 oz (112.1 kg)   SpO2 95%   BMI 31.31 kg/m   HENT:  Head: Normocephalic and atraumatic.  Right Ear: Hearing normal.  Left Ear: Hearing normal.  Eyes: Conjunctivae are normal. No scleral icterus.  Neck: Normal range of motion. Neck supple. No thyromegaly present.  Cardiovascular: Normal rate, regular rhythm and normal heart sounds.  Pulses:      Radial pulses are 2+ on the right side, and 2+ on the left side.  Pulmonary/Chest: Effort normal and breath sounds normal. He exhibits no tenderness.  Lymphadenopathy:       Head (right side): No tonsillar, no preauricular, no posterior auricular and no occipital adenopathy present.       Head (left side): No tonsillar, no preauricular, no posterior auricular and no occipital adenopathy present.    He has no cervical adenopathy.       Right: No supraclavicular adenopathy present.       Left: No supraclavicular adenopathy present.  Neurological: He is alert and oriented to person, place, and time. No sensory deficit.  Skin: Skin is warm, dry and intact. No rash noted. No cyanosis or erythema. Nails show no clubbing.  Psychiatric: He has a  normal mood and affect. His speech is normal and behavior is normal.           Assessment & Plan:   Problem List Items Addressed This Visit    Diabetes mellitus ([place])    Await lab results. If A1C >7% will plan to increase Invokana from 100 mg to 300 mg. COntinue metformin.      Relevant Orders   Hemoglobin A1c (Completed)   Comprehensive metabolic panel (Completed)   Microalbumin / creatinine urine ratio (Completed)   HM DIABETES EYE EXAM (Completed)   HM DIABETES FOOT EXAM (Completed)   Hyperlipidemia    Await lipids. Goal LDL <70. May need to change to more potent statin (currently on lovastatin).      Relevant Orders   Lipid panel (Completed)   Comprehensive metabolic panel (Completed)   Hypertension    Well controlled. Continue quinapril 20 mg daily.      Relevant Orders   CBC with Differential/Platelet (Completed)   Comprehensive  metabolic panel (Completed)    Other Visit Diagnoses    Right-sided chest wall pain    -  Primary   Likely musculoskeletal spasm. Has resolved. update CXR to evaluate for pulmonary process due to tobacco use history.   Relevant Orders   DG Chest 2 View (Completed)   Need for hepatitis C screening test       Relevant Orders   Hepatitis C antibody (Completed)   Screening for HIV (human immunodeficiency virus)       Relevant Orders   HIV antibody (Completed)       Return in about 3 months (around 07/28/2017) for re-evaluation of diabetes, blood pressure. Plan to update vaccines at that visit.  Fara Chute, PA-C [facility] [place]

## 2017-04-29 NOTE — Patient Instructions (Addendum)
Please schedule with your eye specialist: [facility]  [address], [place], [place] [address]  Phone: [phone]  [facility] [address], [place], [place] [address]  Phone: [phone]  Plan to have the Pneumovax 23 and Tdap vaccines at your next visit.  IF you received an x-ray today, you will receive an invoice from [facility]. Please contact [facility] at [phone] with questions or concerns regarding your invoice.   IF you received labwork today, you will receive an invoice from [facility]. Please contact [facility] at [phone] with questions or concerns regarding your invoice.   Our billing staff will not be able to assist you with questions regarding bills from these companies.  You will be contacted with the lab results as soon as they are available. The fastest way to get your results is to activate your My Chart account. Instructions are located on the last page of this paperwork. If you have not heard from us regarding the results in 2 weeks, please contact this office.

## 2017-04-30 LAB — COMPREHENSIVE METABOLIC PANEL
A/G RATIO: 1.7 (ref 1.2–2.2)
ALT: 21 IU/L (ref 0–44)
AST: 16 IU/L (ref 0–40)
Albumin: 4.5 g/dL (ref 3.6–4.8)
Alkaline Phosphatase: 91 IU/L (ref 39–117)
BILIRUBIN TOTAL: 0.6 mg/dL (ref 0.0–1.2)
BUN/Creatinine Ratio: 13 (ref 10–24)
BUN: 10 mg/dL (ref 8–27)
CALCIUM: 9.9 mg/dL (ref 8.6–10.2)
CHLORIDE: 105 mmol/L (ref 96–106)
CO2: 20 mmol/L (ref 20–29)
Creatinine, Ser: 0.8 mg/dL (ref 0.76–1.27)
GFR calc Af Amer: 110 mL/min/{1.73_m2} (ref >59)
GFR, EST NON AFRICAN AMERICAN: 95 mL/min/{1.73_m2} (ref >59)
GLUCOSE: 142 mg/dL — AB (ref 65–99)
Globulin, Total: 2.7 g/dL (ref 1.5–4.5)
POTASSIUM: 4.3 mmol/L (ref 3.5–5.2)
Sodium: 141 mmol/L (ref 134–144)
TOTAL PROTEIN: 7.2 g/dL (ref 6.0–8.5)

## 2017-04-30 LAB — CBC WITH DIFFERENTIAL/PLATELET
BASOS: 0 %
Basophils Absolute: 0 10*3/uL (ref 0.0–0.2)
EOS (ABSOLUTE): 0.1 10*3/uL (ref 0.0–0.4)
EOS: 1 %
HEMATOCRIT: 51.5 % — AB (ref 37.5–51.0)
Hemoglobin: 16.9 g/dL (ref 13.0–17.7)
Immature Grans (Abs): 0 10*3/uL (ref 0.0–0.1)
Immature Granulocytes: 0 %
Lymphocytes Absolute: 2.5 10*3/uL (ref 0.7–3.1)
Lymphs: 31 %
MCH: 27.8 pg (ref 26.6–33.0)
MCHC: 32.8 g/dL (ref 31.5–35.7)
MCV: 85 fL (ref 79–97)
MONOS ABS: 0.6 10*3/uL (ref 0.1–0.9)
Monocytes: 8 %
Neutrophils Absolute: 4.7 10*3/uL (ref 1.4–7.0)
Neutrophils: 60 %
Platelets: 208 10*3/uL (ref 150–379)
RBC: 6.09 x10E6/uL — ABNORMAL HIGH (ref 4.14–5.80)
RDW: 14.4 % (ref 12.3–15.4)
WBC: 8 10*3/uL (ref 3.4–10.8)

## 2017-04-30 LAB — HEPATITIS C ANTIBODY: Hep C Virus Ab: <0.1 s/co ratio (ref 0.0–0.9)

## 2017-04-30 LAB — LIPID PANEL
CHOL/HDL RATIO: 4.1 ratio (ref 0.0–5.0)
Cholesterol, Total: 157 mg/dL (ref 100–199)
HDL: 38 mg/dL — ABNORMAL LOW (ref >39)
LDL CALC: 94 mg/dL (ref 0–99)
TRIGLYCERIDES: 124 mg/dL (ref 0–149)
VLDL Cholesterol Cal: 25 mg/dL (ref 5–40)

## 2017-04-30 LAB — MICROALBUMIN / CREATININE URINE RATIO
Creatinine, Urine: 114.8 mg/dL
MICROALB/CREAT RATIO: 48.2 mg/g{creat} — AB (ref 0.0–30.0)
MICROALBUM., U, RANDOM: 55.3 ug/mL

## 2017-04-30 LAB — HEMOGLOBIN A1C
Est. average glucose Bld gHb Est-mCnc: 177 mg/dL
HEMOGLOBIN A1C: 7.8 % — AB (ref 4.8–5.6)

## 2017-04-30 LAB — HIV ANTIBODY (ROUTINE TESTING W REFLEX): HIV Screen 4th Generation wRfx: NONREACTIVE

## 2017-05-02 NOTE — Assessment & Plan Note (Signed)
Await lab results. If A1C >7% will plan to increase Invokana from 100 mg to 300 mg. COntinue metformin.

## 2017-05-02 NOTE — Assessment & Plan Note (Signed)
Await lipids. Goal LDL <70. May need to change to more potent statin (currently on lovastatin).

## 2017-05-02 NOTE — Assessment & Plan Note (Signed)
Well controlled. Continue quinapril 20 mg daily.

## 2017-05-03 ENCOUNTER — Other Ambulatory Visit: Payer: Self-pay | Admitting: Physician Assistant

## 2017-05-03 DIAGNOSIS — E119 Type 2 diabetes mellitus without complications: Secondary | ICD-10-CM

## 2017-05-03 MED ORDER — CANAGLIFLOZIN 300 MG PO TABS: 300.0000 mg | ORAL_TABLET | Freq: Every day | ORAL | 3 refills | Status: DC

## 2017-05-06 ENCOUNTER — Telehealth: Payer: Self-pay | Admitting: Physician Assistant

## 2017-05-06 NOTE — Telephone Encounter (Signed)
Copied from CRM [id number]. Topic: Quick Communication - See Telephone Encounter >> May 06, 2017  5:08 PM Louie BunPalacios Medina, Rosey Batheresa D wrote: CRM for notification. See Telephone encounter for: 05/06/17. Patient called and would like to talk to Porfirio Oarhelle Jeffery about his canagliflozin (INVOKANA) 300 MG TABS tablet. He is not sure why the doze went up since his A1c is down.

## 2017-05-07 ENCOUNTER — Encounter: Payer: Self-pay | Admitting: Physician Assistant

## 2017-05-08 MED ORDER — CANAGLIFLOZIN 100 MG PO TABS: 100.0000 mg | ORAL_TABLET | Freq: Every day | ORAL | 3 refills | Status: DC

## 2017-05-13 NOTE — Telephone Encounter (Signed)
The A1C DID improve, but not enough. The goal if for the A1C to be below 7%. Since we saw a good improvement with the Invokana at the lower dose, I expect to see more improvement at the higher dose.

## 2017-05-15 NOTE — Telephone Encounter (Signed)
Call placed to patient to inform him of provider response, no answer on phone. Left message for patient to return call to office.

## 2017-05-16 NOTE — Telephone Encounter (Signed)
Chelle, will you clarify.  Changed to 300mg  tabs.  [facility]  [facility]. ;When we talk with pt - with your message of "expect to see more improvement at the higher dose" ....  advise him to go up to 300 mg & we should order that ?

## 2017-05-17 NOTE — Telephone Encounter (Signed)
I agree, this seems very confusing.  There is also an E-Mail.  And a delay between the email and the phone message, and so I got mixed up, too.  The patient's email says that his diet over the holidays was the problem, and the A1C would be down by next time, so we're sticking with the 100 mg for now.  Thank you for clarifying.

## 2017-06-01 ENCOUNTER — Other Ambulatory Visit: Payer: Self-pay | Admitting: Family Medicine

## 2017-06-01 DIAGNOSIS — E119 Type 2 diabetes mellitus without complications: Secondary | ICD-10-CM

## 2017-07-06 ENCOUNTER — Encounter: Payer: Self-pay | Admitting: Physician Assistant

## 2017-08-23 ENCOUNTER — Other Ambulatory Visit: Payer: Self-pay | Admitting: Family Medicine

## 2017-08-23 DIAGNOSIS — E119 Type 2 diabetes mellitus without complications: Secondary | ICD-10-CM

## 2017-09-10 ENCOUNTER — Other Ambulatory Visit: Payer: Self-pay | Admitting: Physician Assistant

## 2017-09-10 ENCOUNTER — Telehealth: Payer: Self-pay | Admitting: Family Medicine

## 2017-09-10 DIAGNOSIS — E119 Type 2 diabetes mellitus without complications: Secondary | ICD-10-CM

## 2017-09-10 NOTE — Telephone Encounter (Signed)
Please schedule pt

## 2017-09-10 NOTE — Telephone Encounter (Signed)
Copied from CRM [id number]. Topic: Quick Communication - Rx Refill/Question >> Sep 10, 2017  4:51 PM Raquel SarnaHayes, Teresa G wrote: lovastatin (MEVACOR) 40 MG tablet  quinapril (ACCUPRIL) 20 MG tablet  Pt has been with out the Rx's for a week.  Please fill asap at :    [id number]- [place], [place] - [address] [facility] [address] [place] [id number] Phone: [phone] Fax: [phone]

## 2017-09-10 NOTE — Telephone Encounter (Signed)
Patient checking status of refill, would like to know if another provider can take over his refills. He has seen Dr Creta LevinStallings and Neva SeatGreene before. Please advise. Call back [phone]

## 2017-09-11 ENCOUNTER — Other Ambulatory Visit: Payer: Self-pay

## 2017-09-11 DIAGNOSIS — E119 Type 2 diabetes mellitus without complications: Secondary | ICD-10-CM

## 2017-09-11 NOTE — Telephone Encounter (Signed)
Patient states that Chelle was to send all meds back in Feb and only sent one, states he was just seen in 03/2017. Per pt  does not need appointment, needs his meds called in, or he will find another dr will not be held hostage for his meds.

## 2017-09-11 NOTE — Telephone Encounter (Signed)
LOV  04/29/17 Christian Harrington

## 2017-09-13 NOTE — Telephone Encounter (Signed)
Message sent to Chelle 

## 2017-09-15 ENCOUNTER — Ambulatory Visit: Payer: 59 | Admitting: Urgent Care

## 2017-09-15 ENCOUNTER — Encounter: Payer: Self-pay | Admitting: Urgent Care

## 2017-09-15 VITALS — BP 143/75 | HR 68 | Temp 98.1°F | Resp 17 | Ht 74.5 in | Wt 250.0 lb

## 2017-09-15 DIAGNOSIS — E119 Type 2 diabetes mellitus without complications: Secondary | ICD-10-CM

## 2017-09-15 DIAGNOSIS — E782 Mixed hyperlipidemia: Secondary | ICD-10-CM | POA: Diagnosis not present

## 2017-09-15 LAB — HEMOGLOBIN A1C
ESTIMATED AVERAGE GLUCOSE: 163 mg/dL
Hgb A1c MFr Bld: 7.3 % — ABNORMAL HIGH (ref 4.8–5.6)

## 2017-09-15 MED ORDER — LOVASTATIN 40 MG PO TABS: 40.0000 mg | ORAL_TABLET | Freq: Every day | ORAL | 3 refills | Status: DC

## 2017-09-15 MED ORDER — METFORMIN HCL 500 MG PO TABS: 1000.0000 mg | ORAL_TABLET | Freq: Two times a day (BID) | ORAL | 3 refills | Status: DC

## 2017-09-15 MED ORDER — QUINAPRIL HCL 20 MG PO TABS: 20.0000 mg | ORAL_TABLET | Freq: Every day | ORAL | 3 refills | Status: DC

## 2017-09-15 MED ORDER — CANAGLIFLOZIN 100 MG PO TABS: 100.0000 mg | ORAL_TABLET | Freq: Every day | ORAL | 3 refills | Status: DC

## 2017-09-15 MED ORDER — GLUCOSE BLOOD VI STRP: ORAL_STRIP | 12 refills | Status: DC

## 2017-09-15 NOTE — Patient Instructions (Addendum)
DPP-IV inhibitors Sitagliptin Saxagliptin Linagliptin    Diabetes Mellitus and Nutrition When you have diabetes (diabetes mellitus), it is very important to have healthy eating habits because your blood sugar (glucose) levels are greatly affected by what you eat and drink. Eating healthy foods in the appropriate amounts, at about the same times every day, can help you:  Control your blood glucose.  Lower your risk of heart disease.  Improve your blood pressure.  Reach or maintain a healthy weight.  Every person with diabetes is different, and each person has different needs for a meal plan. Your health care provider may recommend that you work with a diet and nutrition specialist (dietitian) to make a meal plan that is best for you. Your meal plan may vary depending on factors such as:  The calories you need.  The medicines you take.  Your weight.  Your blood glucose, blood pressure, and cholesterol levels.  Your activity level.  Other health conditions you have, such as heart or kidney disease.  How do carbohydrates affect me? Carbohydrates affect your blood glucose level more than any other type of food. Eating carbohydrates naturally increases the amount of glucose in your blood. Carbohydrate counting is a method for keeping track of how many carbohydrates you eat. Counting carbohydrates is important to keep your blood glucose at a healthy level, especially if you use insulin or take certain oral diabetes medicines. It is important to know how many carbohydrates you can safely have in each meal. This is different for every person. Your dietitian can help you calculate how many carbohydrates you should have at each meal and for snack. Foods that contain carbohydrates include:  Bread, cereal, rice, pasta, and crackers.  Potatoes and corn.  Peas, beans, and lentils.  Milk and yogurt.  Fruit and juice.  Desserts, such as cakes, cookies, ice cream, and candy.  How does  alcohol affect me? Alcohol can cause a sudden decrease in blood glucose (hypoglycemia), especially if you use insulin or take certain oral diabetes medicines. Hypoglycemia can be a life-threatening condition. Symptoms of hypoglycemia (sleepiness, dizziness, and confusion) are similar to symptoms of having too much alcohol. If your health care provider says that alcohol is safe for you, follow these guidelines:  Limit alcohol intake to no more than 1 drink per day for nonpregnant women and 2 drinks per day for men. One drink equals 12 oz of beer, 5 oz of wine, or 1 oz of hard liquor.  Do not drink on an empty stomach.  Keep yourself hydrated with water, diet soda, or unsweetened iced tea.  Keep in mind that regular soda, juice, and other mixers may contain a lot of sugar and must be counted as carbohydrates.  What are tips for following this plan? Reading food labels  Start by checking the serving size on the label. The amount of calories, carbohydrates, fats, and other nutrients listed on the label are based on one serving of the food. Many foods contain more than one serving per package.  Check the total grams (g) of carbohydrates in one serving. You can calculate the number of servings of carbohydrates in one serving by dividing the total carbohydrates by 15. For example, if a food has 30 g of total carbohydrates, it would be equal to 2 servings of carbohydrates.  Check the number of grams (g) of saturated and trans fats in one serving. Choose foods that have low or no amount of these fats.  Check the number of milligrams (  mg) of sodium in one serving. Most people should limit total sodium intake to less than 2,300 mg per day.  Always check the nutrition information of foods labeled as "low-fat" or "nonfat". These foods may be higher in added sugar or refined carbohydrates and should be avoided.  Talk to your dietitian to identify your daily goals for nutrients listed on the  label. Shopping  Avoid buying canned, premade, or processed foods. These foods tend to be high in fat, sodium, and added sugar.  Shop around the outside edge of the grocery store. This includes fresh fruits and vegetables, bulk grains, fresh meats, and fresh dairy. Cooking  Use low-heat cooking methods, such as baking, instead of high-heat cooking methods like deep frying.  Cook using healthy oils, such as olive, canola, or sunflower oil.  Avoid cooking with butter, cream, or high-fat meats. Meal planning  Eat meals and snacks regularly, preferably at the same times every day. Avoid going long periods of time without eating.  Eat foods high in fiber, such as fresh fruits, vegetables, beans, and whole grains. Talk to your dietitian about how many servings of carbohydrates you can eat at each meal.  Eat 4-6 ounces of lean protein each day, such as lean meat, chicken, fish, eggs, or tofu. 1 ounce is equal to 1 ounce of meat, chicken, or fish, 1 egg, or 1/4 cup of tofu.  Eat some foods each day that contain healthy fats, such as avocado, nuts, seeds, and fish. Lifestyle   Check your blood glucose regularly.  Exercise at least 30 minutes 5 or more days each week, or as told by your health care provider.  Take medicines as told by your health care provider.  Do not use any products that contain nicotine or tobacco, such as cigarettes and e-cigarettes. If you need help quitting, ask your health care provider.  Work with a Veterinary surgeoncounselor or diabetes educator to identify strategies to manage stress and any emotional and social challenges. What are some questions to ask my health care provider?  Do I need to meet with a diabetes educator?  Do I need to meet with a dietitian?  What number can I call if I have questions?  When are the best times to check my blood glucose? Where to find more information:  American Diabetes Association: diabetes.org/food-and-fitness/food  Academy of  Nutrition and Dietetics: https://www.vargas.com/www.eatright.org/resources/health/diseases-and-conditions/diabetes  [employer] Institute of Diabetes and Digestive and Kidney Diseases (NIH): FindJewelers.czwww.niddk.nih.gov/health-information/diabetes/overview/diet-eating-physical-activity Summary  A healthy meal plan will help you control your blood glucose and maintain a healthy lifestyle.  Working with a diet and nutrition specialist (dietitian) can help you make a meal plan that is best for you.  Keep in mind that carbohydrates and alcohol have immediate effects on your blood glucose levels. It is important to count carbohydrates and to use alcohol carefully. This information is not intended to replace advice given to you by your health care provider. Make sure you discuss any questions you have with your health care provider. Document Released: 12/12/2004 Document Revised: 04/21/2016 Document Reviewed: 04/21/2016 Elsevier Interactive Patient Education  2018 [employer]    IF you received an x-ray today, you will receive an invoice from [facility]. Please contact [facility] at [phone] with questions or concerns regarding your invoice.   IF you received labwork today, you will receive an invoice from [facility]. Please contact [facility] at [phone] with questions or concerns regarding your invoice.   Our billing staff will not be able to assist you with  questions regarding bills from these companies.  You will be contacted with the lab results as soon as they are available. The fastest way to get your results is to activate your My Chart account. Instructions are located on the last page of this paperwork. If you have not heard from [place] regarding the results in 2 weeks, please contact this office.     \

## 2017-09-15 NOTE — Progress Notes (Signed)
    MRN: [id number]  Subjective:   Christian Harrington is a 62 y.o. male who presents for follow up of Type 2 Diabetes Mellitus, HL.   Patient is currently managed with '500mg'$  2 tablets twice daily, invokana '100mg'$  daily. Patient is checking home blood sugars. Home blood sugar is generally 150's but does get readings into 200's.  Patient reports that he is upset that he does not have refills for his medications since he requested them at his last OV 03/2017. He also admits that he has not been compliant with his medications. Take 1,'000mg'$  metformin once daily. Is not compliant with is diet. He would like refills of quinapril, lovastatin. He requested we have diagnosis of hypertension removed from his problem list. Denies smoking cigarettes, quit in 1993. Denies alcohol use.  Christian Harrington has a current medication list which includes the following prescription(s): aspirin, blood glucose meter kit and supplies, canagliflozin, vitamin d3, cinnamon, forskolin, garcinia cambogia-chromium, glucose blood, lovastatin, metformin, naproxen, fish oil, and quinapril. Also has No Known Allergies.  Christian Harrington  has a past medical history of Diabetes mellitus without complication ([place]). Also  has a past surgical history that includes Tonsillectomy; Cataract extraction (Bilateral); and Shoulder arthroscopy (Right, 2011).  Objective:   PHYSICAL EXAM BP (!) 143/75   Pulse 68   Temp 98.1 F (36.7 C) (Oral)   Resp 17   Ht 6' 2.5" (1.892 m)   Wt 250 lb (113.4 kg)   SpO2 98%   BMI 31.67 kg/m   BP Readings from Last 3 Encounters:  09/15/17 (!) 143/75  04/29/17 108/68  06/11/16 122/72    Physical Exam  Constitutional: He is oriented to person, place, and time. He appears well-developed and well-nourished.  Cardiovascular: Normal rate.  Pulmonary/Chest: Effort normal.  Neurological: He is alert and oriented to person, place, and time.   Assessment and Plan :   Type 2 diabetes mellitus without complication, without long-term  current use of insulin (HCC) - Plan: lovastatin (MEVACOR) 40 MG tablet, metFORMIN (GLUCOPHAGE) 500 MG tablet, quinapril (ACCUPRIL) 20 MG tablet, Hemoglobin A1c, glucose blood test strip  Mixed hyperlipidemia  Counseled on dietary and medical compliance. We discussed black box warning with Invokana. He does not want to change anything at this point. Would like to wait and see about his a1c. Follow up in 6 months or sooner as dictated by his blood work.   Jaynee Eagles, PA-C [facility] [place] [phone] 09/15/2017 11:19 AM

## 2017-09-15 NOTE — Telephone Encounter (Signed)
Look like patient had an appt today.

## 2017-12-03 ENCOUNTER — Telehealth: Payer: Self-pay | Admitting: Urgent Care

## 2017-12-03 NOTE — Telephone Encounter (Signed)
Called pt. To reschedule their appt. With PA mani. Left detailed VM  ° °

## 2018-01-01 ENCOUNTER — Ambulatory Visit (INDEPENDENT_AMBULATORY_CARE_PROVIDER_SITE_OTHER): Payer: 59 | Admitting: Family Medicine

## 2018-01-01 ENCOUNTER — Ambulatory Visit (INDEPENDENT_AMBULATORY_CARE_PROVIDER_SITE_OTHER): Payer: Self-pay

## 2018-01-01 ENCOUNTER — Encounter (INDEPENDENT_AMBULATORY_CARE_PROVIDER_SITE_OTHER): Payer: Self-pay | Admitting: Family Medicine

## 2018-01-01 DIAGNOSIS — G8929 Other chronic pain: Secondary | ICD-10-CM

## 2018-01-01 DIAGNOSIS — M545 Low back pain, unspecified: Secondary | ICD-10-CM

## 2018-01-01 DIAGNOSIS — M79604 Pain in right leg: Secondary | ICD-10-CM

## 2018-01-01 MED ORDER — NABUMETONE 500 MG PO TABS: 500.0000 mg | ORAL_TABLET | Freq: Two times a day (BID) | ORAL | 3 refills | Status: DC | PRN

## 2018-01-01 MED ORDER — TIZANIDINE HCL 2 MG PO TABS: 2.0000 mg | ORAL_TABLET | Freq: Four times a day (QID) | ORAL | 1 refills | Status: DC | PRN

## 2018-01-01 NOTE — Progress Notes (Signed)
Office Visit Note   Patient: Christian Harrington           Date of Birth: 10-08-1955           MRN: [id number] Visit Date: 01/01/2018 Requested by: Wallis Bamberg, PA-C [address] [place], [place] [address] PCP: Patient, No Pcp Per  Subjective: Chief Complaint  Patient presents with  . Right Hip - Pain    Patient states he has pain in his right hip down to his right hamstring, and right knee. Started getting worse. Hurts to sit for a long time.  Discomfort when kneeling and stooping.   Taking Naproxen  . Right Knee - Pain    HPI: He is here with right sided low back and leg pain.  Symptoms started about a year and a half ago, no definite injury.  He works on his feet 12-hour shifts and he thinks this might have contributed.  He has pain in the buttocks and hamstring, and pain in the groin area.  He also has a history of low back problems.  He takes Aleve with temporary improvement.              ROS: He has diabetes, all other systems were negative.  Objective: Vital Signs: There were no vitals taken for this visit.  Physical Exam:  Back: No tenderness lumbar spinous processes or SI joint.  Slightly tender in the sciatic notch on the right. Right hip: Tender in the ischial tuberosity posteriorly.  No tenderness over the greater trochanter.  Mild pain with internal hip rotation.  Straight leg raise is negative, lower extremity strength and reflexes are normal.  He has tenderness along the mid hamstring muscle belly.  Imaging: 2 view lumbar spine x-rays: Moderate to severe degenerative disc disease at L5-S1.  Neural foramen appears to be narrowed.  2 view right hip x-rays: Well-preserved joint space, very minimal arthritic change.  He does have some DJD of the sacroiliac joints as well as bony surface irregularity of the initial tuberosity on the right.   Assessment & Plan: 1.  Right leg pain, possibilities include hamstring tendinopathy versus lumbar foraminal stenosis -Trial of home  exercises for the lumbar spine and the hamstring.  If symptoms persist, consider lumbar MRI scan followed by epidural injection if indicated. -Physical therapy would be another consideration although his work schedule will not permit that right now. -Trial of Zanaflex and Relafen as needed.   Follow-Up Instructions: No follow-ups on file.       Procedures: None today.   PMFS History: Patient Active Problem List   Diagnosis Date Noted  . Diabetes mellitus (HCC) 03/29/2015  . Hyperlipidemia 03/29/2015   Past Medical History:  Diagnosis Date  . Diabetes mellitus without complication (HCC)     Family History  Problem Relation Age of Onset  . Heart disease Father 7       CHF  . Diabetes Sister   . Arthritis Mother        OA, DDD  . Osteoporosis Mother   . Diabetes Maternal Grandmother     Past Surgical History:  Procedure Laterality Date  . CATARACT EXTRACTION Bilateral   . SHOULDER ARTHROSCOPY Right 2011  . TONSILLECTOMY     Social History   Occupational History  . 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 Smoking status: Former Smoker    Last attempt to quit: 04/01/1991    Years since quitting: 26.7  . Smokeless  tobacco: Never Used  Substance and Sexual Activity  . Alcohol use: No    Alcohol/week: 0.0 standard drinks  . Drug use: No  . Sexual activity: Not on file

## 2018-02-25 IMAGING — DX DG CHEST 2V
2 series · 2 of 2 positions shown · non-contrast
Comparison: None.

CLINICAL DATA: Right-sided chest pain.

EXAM:
CHEST  2 VIEW

[chest pa]
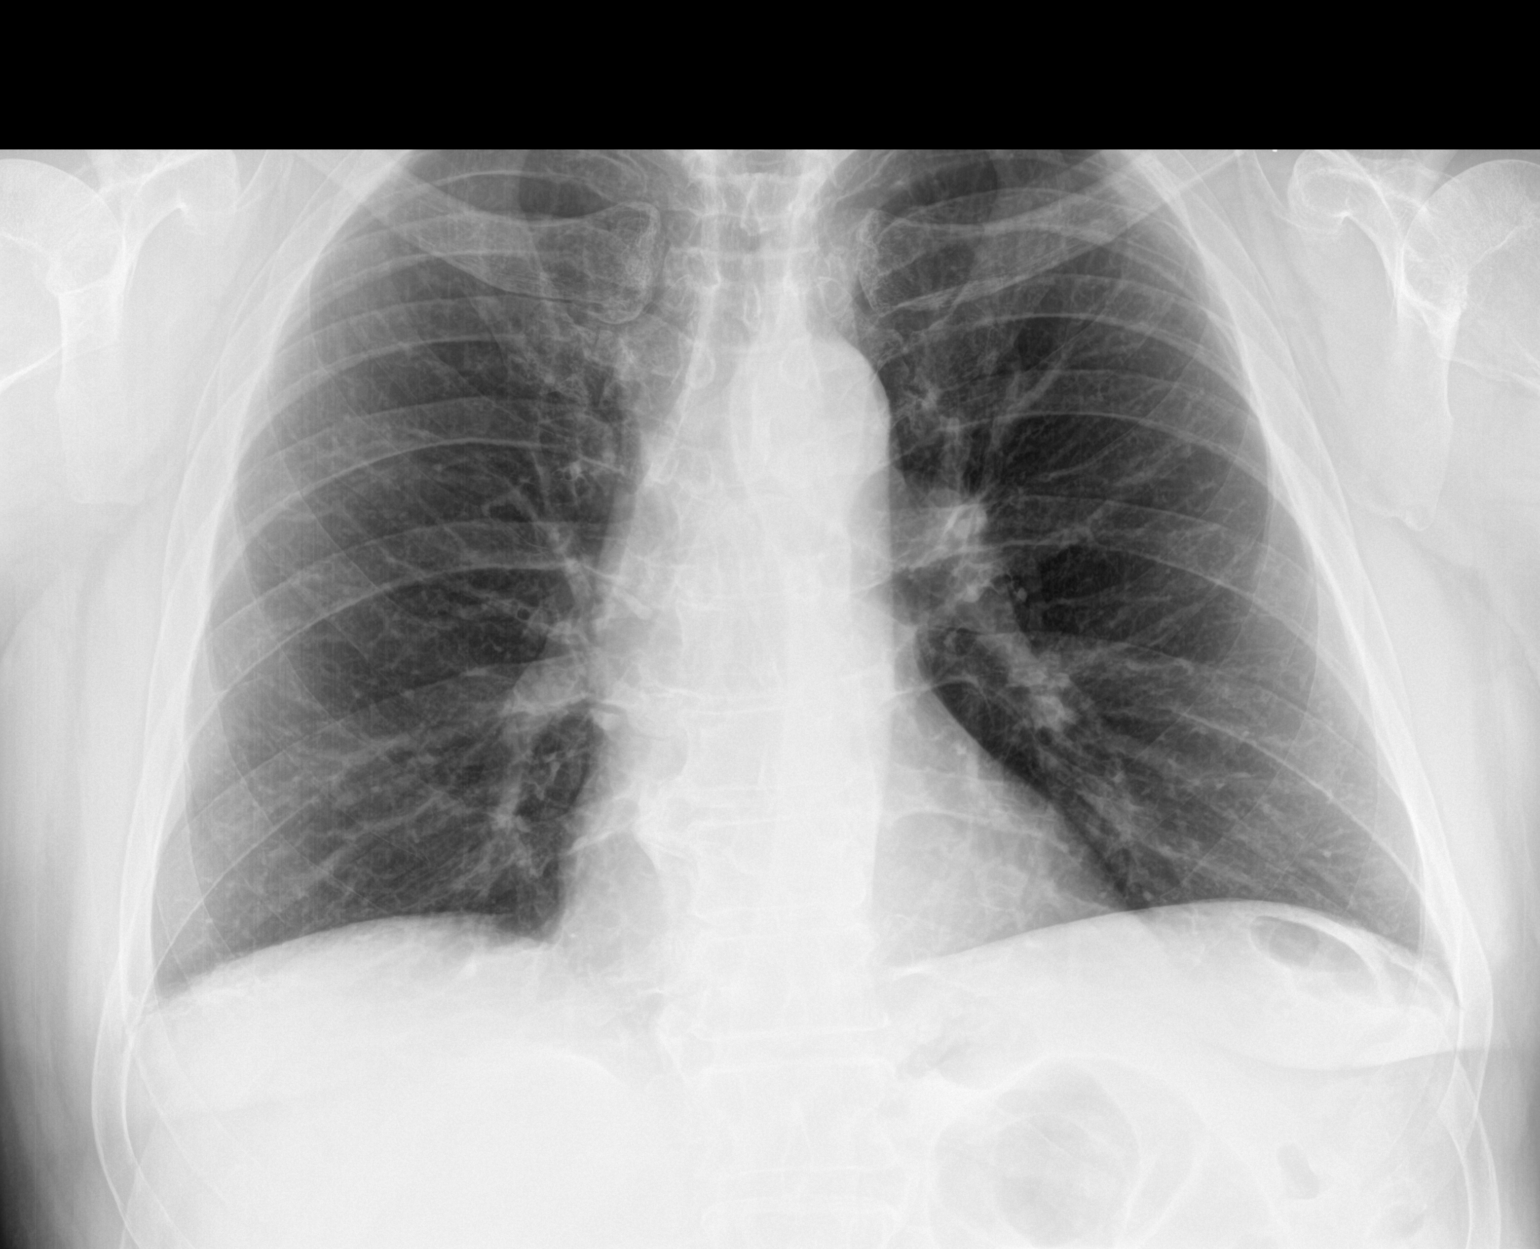

[chest lat]
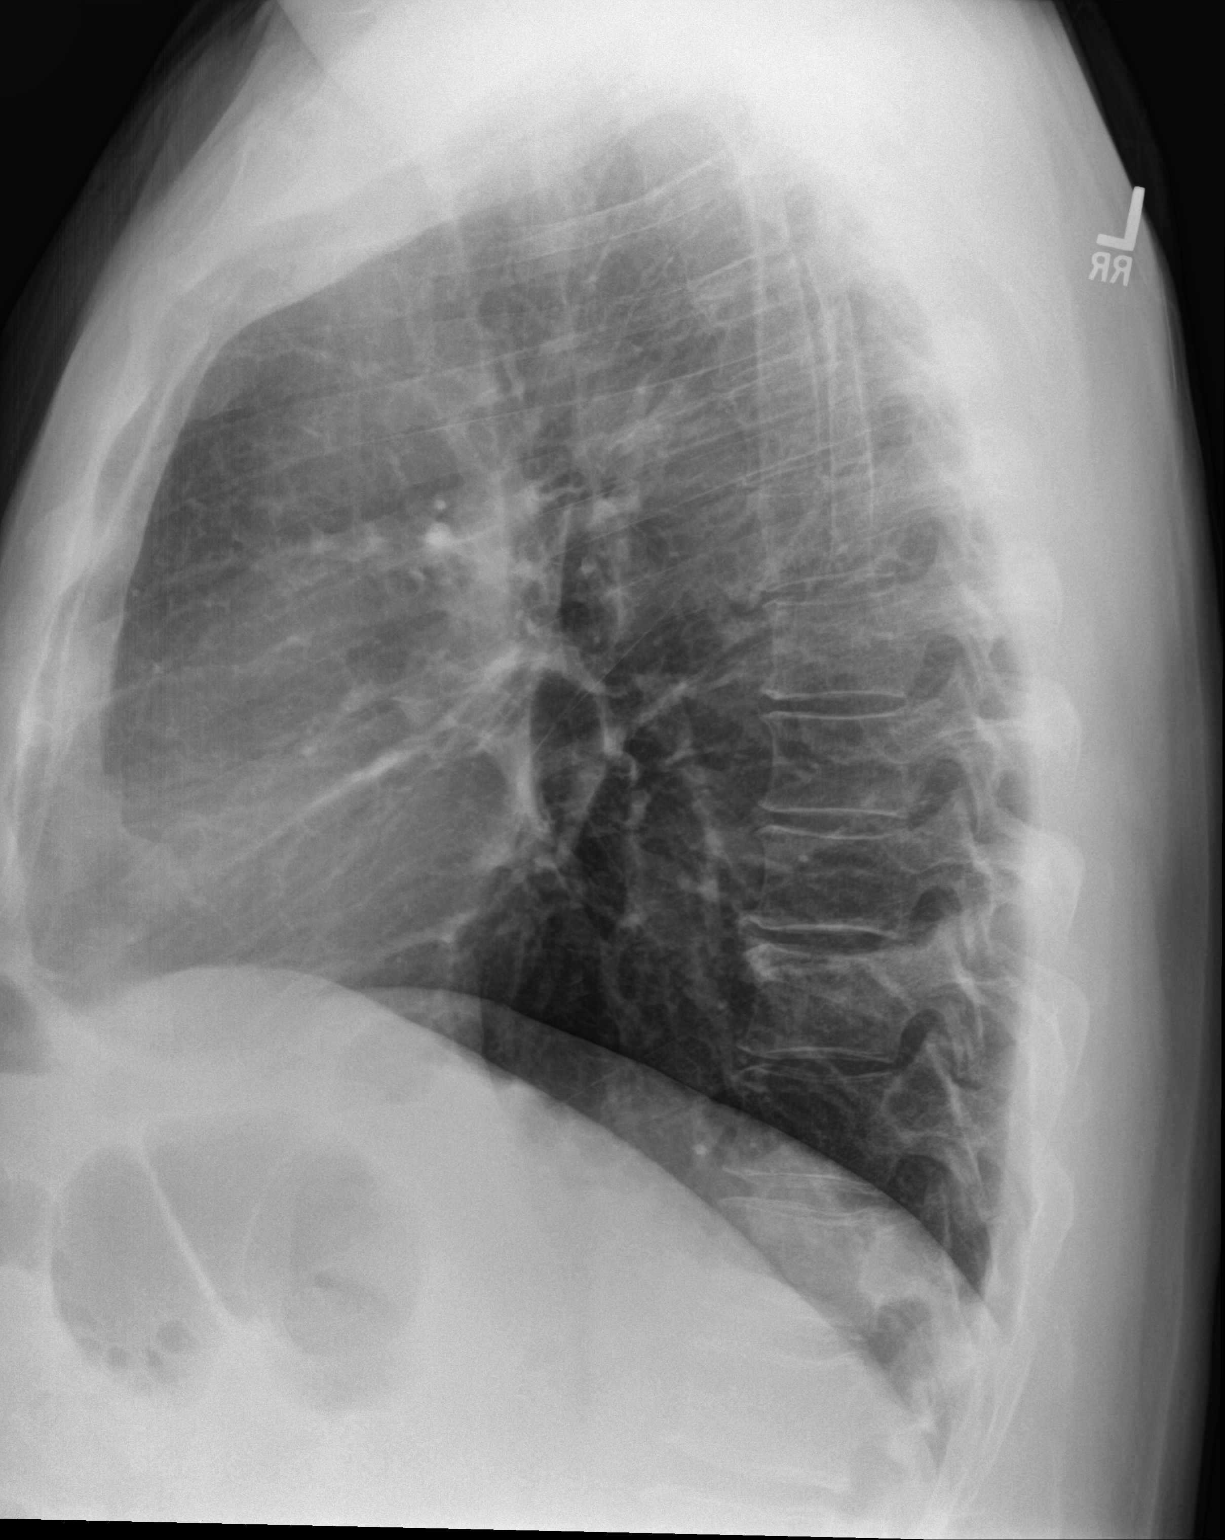

[2 of 2 positions shown; findings below may reference images not displayed]

FINDINGS: The heart size and mediastinal contours are within normal limits.
Both lungs are clear. No pneumothorax or pleural effusion is noted.
The visualized skeletal structures are unremarkable.
IMPRESSION: No active cardiopulmonary disease.

## 2018-03-18 ENCOUNTER — Ambulatory Visit: Payer: 59 | Admitting: Urgent Care

## 2018-10-13 ENCOUNTER — Ambulatory Visit: Payer: 59 | Admitting: Emergency Medicine

## 2018-10-13 ENCOUNTER — Encounter: Payer: Self-pay | Admitting: Emergency Medicine

## 2018-10-13 ENCOUNTER — Other Ambulatory Visit: Payer: Self-pay

## 2018-10-13 VITALS — BP 123/76 | HR 71 | Temp 98.3°F | Resp 16 | Ht 72.0 in | Wt 235.0 lb

## 2018-10-13 DIAGNOSIS — Z23 Encounter for immunization: Secondary | ICD-10-CM

## 2018-10-13 DIAGNOSIS — E782 Mixed hyperlipidemia: Secondary | ICD-10-CM | POA: Diagnosis not present

## 2018-10-13 DIAGNOSIS — Z1211 Encounter for screening for malignant neoplasm of colon: Secondary | ICD-10-CM | POA: Diagnosis not present

## 2018-10-13 DIAGNOSIS — E119 Type 2 diabetes mellitus without complications: Secondary | ICD-10-CM

## 2018-10-13 LAB — POCT URINALYSIS DIP (MANUAL ENTRY)
Bilirubin, UA: NEGATIVE
Blood, UA: NEGATIVE
Glucose, UA: 500 mg/dL — AB
Ketones, POC UA: NEGATIVE mg/dL
Leukocytes, UA: NEGATIVE
Nitrite, UA: NEGATIVE
Protein Ur, POC: NEGATIVE mg/dL
Spec Grav, UA: 1.025 (ref 1.010–1.025)
Urobilinogen, UA: 0.2 E.U./dL
pH, UA: 6 (ref 5.0–8.0)

## 2018-10-13 MED ORDER — NAPROXEN 500 MG PO TABS: 500.0000 mg | ORAL_TABLET | Freq: Two times a day (BID) | ORAL | 1 refills | Status: AC

## 2018-10-13 MED ORDER — METFORMIN HCL 500 MG PO TABS: 1000.0000 mg | ORAL_TABLET | Freq: Two times a day (BID) | ORAL | 3 refills | Status: DC

## 2018-10-13 MED ORDER — LOVASTATIN 40 MG PO TABS: 40.0000 mg | ORAL_TABLET | Freq: Every day | ORAL | 3 refills | Status: DC

## 2018-10-13 MED ORDER — CANAGLIFLOZIN 100 MG PO TABS: 100.0000 mg | ORAL_TABLET | Freq: Every day | ORAL | 3 refills | Status: DC

## 2018-10-13 MED ORDER — QUINAPRIL HCL 20 MG PO TABS: 20.0000 mg | ORAL_TABLET | Freq: Every day | ORAL | 3 refills | Status: DC

## 2018-10-13 NOTE — Progress Notes (Signed)
Christian Harrington 63 y.o.   Chief Complaint  Patient presents with   Medication Refill    all on list    HISTORY OF PRESENT ILLNESS: This is a 63 y.o. male first visit with me.  Here for follow-up of chronic medical problems and medication refill.  Has the following chronic medical problems: 1.  Diabetes type 2: On metformin and Invokana.  Glucose at home between 120 and 140.  On ACE inhibitor for renal protection. 2.  Dyslipidemia: On lovastatin.  HPI   Prior to Admission medications   Medication Sig Start Date End Date Taking? Authorizing Provider  aspirin 325 MG tablet Take 325 mg by mouth every other day.    Yes [provider]  blood glucose meter kit and supplies KIT Dispense per patient and insurance preference. Use up to four times daily FOR ICD-9 250.00, 250.01 06/11/16  Yes Alveda Reasons, MD  canagliflozin [facility]) 100 MG TABS tablet Take 1 tablet (100 mg total) by mouth daily before breakfast. 09/15/17  Yes Jaynee Eagles, PA-C  Cholecalciferol (VITAMIN D3) 400 units CAPS Take 500 Units by mouth.   Yes [provider]  Cinnamon 500 MG TABS Take 1 tablet by mouth.   Yes [provider]  Forskolin POWD by Does not apply route.   Yes [provider]  Garcinia Cambogia-Chromium 500-200 MG-MCG TABS Take 1 tablet by mouth.   Yes [provider]  glucose blood test strip Use as instructed 09/15/17  Yes Jaynee Eagles, PA-C  lovastatin (MEVACOR) 40 MG tablet Take 1 tablet (40 mg total) by mouth at bedtime. 09/15/17  Yes Jaynee Eagles, PA-C  metFORMIN (GLUCOPHAGE) 500 MG tablet Take 2 tablets (1,000 mg total) by mouth 2 (two) times daily with a meal. 09/15/17  Yes Jaynee Eagles, PA-C  nabumetone (RELAFEN) 500 MG tablet Take 1 tablet (500 mg total) by mouth 2 (two) times daily as needed. 01/01/18  Yes [name], Legrand Como, MD  naproxen (NAPROSYN) 500 MG tablet Take 500 mg by mouth. 09/07/14  Yes [provider]  Omega-3 Fatty Acids (FISH OIL) 1000 MG  CAPS Take 1 capsule by mouth.   Yes [provider]  quinapril (ACCUPRIL) 20 MG tablet Take 1 tablet (20 mg total) by mouth daily. 09/15/17  Yes Jaynee Eagles, PA-C  tiZANidine (ZANAFLEX) 2 MG tablet Take 1-2 tablets (2-4 mg total) by mouth every 6 (six) hours as needed for muscle spasms. 01/01/18  Yes [name], Legrand Como, MD    No Known Allergies  Patient Active Problem List   Diagnosis Date Noted   Diabetes mellitus ([place]) 03/29/2015   Hyperlipidemia 03/29/2015    Past Medical History:  Diagnosis Date   Diabetes mellitus without complication ([place])     Past Surgical History:  Procedure Laterality Date   CATARACT EXTRACTION Bilateral    SHOULDER ARTHROSCOPY Right 2011   TONSILLECTOMY      Social History   Socioeconomic History   Marital status: Married    Spouse name: Not on file   Number of children: 4   Years of education: Not on file   Highest education level: Not on file  Occupational History   Occupation: Scientist, product/process development    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[place] resource strain: Not on file   Food insecurity    Worry: Not on file    Inability: Not on file   Transportation needs    Medical: Not on file    Non-medical: Not on file  Tobacco Use   Smoking status: Former Smoker    Quit date: 04/01/1991    Years since quitting: 27.5   Smokeless tobacco: Never Used  Substance and Sexual Activity   Alcohol use: No    Alcohol/week: 0.0 standard drinks   Drug use: No   Sexual activity: Not on file  Lifestyle   Physical activity    Days per week: Not on file    Minutes per session: Not on file   Stress: Not on file  Relationships   Social connections    Talks on phone: Not on file    Gets together: Not on file    Attends religious service: Not on file    Active member of club or organization: Not on file    Attends meetings of clubs or organizations: Not on file    Relationship status: Not on file   Intimate partner  violence    Fear of current or ex partner: Not on file    Emotionally abused: Not on file    Physically abused: Not on file    Forced sexual activity: Not on file  Other Topics Concern   Not on file  Social History Narrative   Lives with his wife, who is on disability.   4 adult children live independently, scattered across the country.    Family History  Problem Relation Age of Onset   Heart disease Father 56       CHF   Diabetes Sister    Arthritis Mother        OA, DDD   Osteoporosis Mother    Diabetes Maternal Grandmother      Review of Systems  Constitutional: Positive for weight loss (Down from 300 pounds, intentional). Negative for chills and fever.  HENT: Negative.   Eyes: Negative.   Respiratory: Negative.  Negative for cough and shortness of breath.   Cardiovascular: Negative.  Negative for chest pain and palpitations.  Gastrointestinal: Negative for abdominal pain, diarrhea, nausea and vomiting.  Genitourinary: Negative.   Musculoskeletal: Positive for back pain.       Intermittent pain to right buttock area. Intermittent pain to both hands.  May be associated with pressing activities of lately.  Skin: Negative.  Negative for rash.  Neurological: Negative.  Negative for dizziness and headaches.  Endo/Heme/Allergies: Negative.   All other systems reviewed and are negative.  Vitals:   10/13/18 1125  BP: 123/76  Pulse: 71  Resp: 16  Temp: 98.3 F (36.8 C)  SpO2: 96%     Physical Exam Vitals signs reviewed.  Constitutional:      Appearance: Normal appearance.  HENT:     Head: Normocephalic and atraumatic.  Eyes:     Extraocular Movements: Extraocular movements intact.     Conjunctiva/sclera: Conjunctivae normal.     Pupils: Pupils are equal, round, and reactive to light.  Neck:     Musculoskeletal: Normal range of motion and neck supple.     Vascular: No carotid bruit.  Cardiovascular:     Rate and Rhythm: Normal rate and regular rhythm.       Pulses: Normal pulses.     Heart sounds: Normal heart sounds.  Pulmonary:     Effort: Pulmonary effort is normal.     Breath sounds: Normal breath sounds.  Musculoskeletal: Normal range of motion.  Skin:    General: Skin is warm and dry.     Capillary Refill: Capillary refill takes less than 2 seconds.  Neurological:  General: No focal deficit present.     Mental Status: He is alert and oriented to person, place, and time.  Psychiatric:        Mood and Affect: Mood normal.        Behavior: Behavior normal.      ASSESSMENT & PLAN: Christian Harrington was seen today for medication refill.  Diagnoses and all orders for this visit:  Type 2 diabetes mellitus without complication, without long-term current use of insulin (HCC) -     Hemoglobin A1c -     Microalbumin/Creatinine Ratio, Urine -     Comprehensive metabolic panel -     HM Diabetes Foot Exam -     metFORMIN (GLUCOPHAGE) 500 MG tablet; Take 2 tablets (1,000 mg total) by mouth 2 (two) times daily with a meal. -     canagliflozin (INVOKANA) 100 MG TABS tablet; Take 1 tablet (100 mg total) by mouth daily before breakfast. -     quinapril (ACCUPRIL) 20 MG tablet; Take 1 tablet (20 mg total) by mouth daily. -     lovastatin (MEVACOR) 40 MG tablet; Take 1 tablet (40 mg total) by mouth at bedtime. -     POCT urinalysis dipstick  Mixed hyperlipidemia -     Lipid panel  Colon cancer screening -     Cologuard  Need for Tdap vaccination -     Tdap vaccine greater than or equal to 7yo IM  Other orders -     naproxen (NAPROSYN) 500 MG tablet; Take 1 tablet (500 mg total) by mouth 2 (two) times daily with a meal. Take only as needed.    Patient Instructions       If you have lab work done today you will be contacted with your lab results within the next 2 weeks.  If you have not heard from [place] then please contact us. The fastest way to get your results is to register for My Chart.   IF you received an x-ray today, you will  receive an invoice from [facility]. Please contact [facility] at [phone] with questions or concerns regarding your invoice.   IF you received labwork today, you will receive an invoice from [facility]. Please contact [facility] at [phone] with questions or concerns regarding your invoice.   Our billing staff will not be able to assist you with questions regarding bills from these companies.  You will be contacted with the lab results as soon as they are available. The fastest way to get your results is to activate your My Chart account. Instructions are located on the last page of this paperwork. If you have not heard from [place] regarding the results in 2 weeks, please contact this office.      Diabetes Mellitus and Nutrition, Adult When you have diabetes (diabetes mellitus), it is very important to have healthy eating habits because your blood sugar (glucose) levels are greatly affected by what you eat and drink. Eating healthy foods in the appropriate amounts, at about the same times every day, can help you:  Control your blood glucose.  Lower your risk of heart disease.  Improve your blood pressure.  Reach or maintain a healthy weight. Every person with diabetes is different, and each person has different needs for a meal plan. Your health care provider may recommend that you work with a diet and nutrition specialist (dietitian) to make a meal plan that is best for you. Your meal plan may vary depending on factors such as:  The calories you need.  The medicines you take.  Your weight.  Your blood glucose, blood pressure, and cholesterol levels.  Your activity level.  Other health conditions you have, such as heart or kidney disease. How do carbohydrates affect me? Carbohydrates, also called carbs, affect your blood glucose level more than any other type of food. Eating carbs naturally raises the amount of glucose in your blood. Carb counting is a method  for keeping track of how many carbs you eat. Counting carbs is important to keep your blood glucose at a healthy level, especially if you use insulin or take certain oral diabetes medicines. It is important to know how many carbs you can safely have in each meal. This is different for every person. Your dietitian can help you calculate how many carbs you should have at each meal and for each snack. Foods that contain carbs include:  Bread, cereal, rice, pasta, and crackers.  Potatoes and corn.  Peas, beans, and lentils.  Milk and yogurt.  Fruit and juice.  Desserts, such as cakes, cookies, ice cream, and candy. How does alcohol affect me? Alcohol can cause a sudden decrease in blood glucose (hypoglycemia), especially if you use insulin or take certain oral diabetes medicines. Hypoglycemia can be a life-threatening condition. Symptoms of hypoglycemia (sleepiness, dizziness, and confusion) are similar to symptoms of having too much alcohol. If your health care provider says that alcohol is safe for you, follow these guidelines:  Limit alcohol intake to no more than 1 drink per day for nonpregnant women and 2 drinks per day for men. One drink equals 12 oz of beer, 5 oz of wine, or 1 oz of hard liquor.  Do not drink on an empty stomach.  Keep yourself hydrated with water, diet soda, or unsweetened iced tea.  Keep in mind that regular soda, juice, and other mixers may contain a lot of sugar and must be counted as carbs. What are tips for following this plan?  Reading food labels  Start by checking the serving size on the "Nutrition Facts" label of packaged foods and drinks. The amount of calories, carbs, fats, and other nutrients listed on the label is based on one serving of the item. Many items contain more than one serving per package.  Check the total grams (g) of carbs in one serving. You can calculate the number of servings of carbs in one serving by dividing the total carbs by 15.  For example, if a food has 30 g of total carbs, it would be equal to 2 servings of carbs.  Check the number of grams (g) of saturated and trans fats in one serving. Choose foods that have low or no amount of these fats.  Check the number of milligrams (mg) of salt (sodium) in one serving. Most people should limit total sodium intake to less than 2,300 mg per day.  Always check the nutrition information of foods labeled as "low-fat" or "nonfat". These foods may be higher in added sugar or refined carbs and should be avoided.  Talk to your dietitian to identify your daily goals for nutrients listed on the label. Shopping  Avoid buying canned, premade, or processed foods. These foods tend to be high in fat, sodium, and added sugar.  Shop around the outside edge of the grocery store. This includes fresh fruits and vegetables, bulk grains, fresh meats, and fresh dairy. Cooking  Use low-heat cooking methods, such as baking, instead of high-heat cooking methods like deep frying.  Cook using healthy oils, such as olive, canola, or sunflower oil.  Avoid cooking with butter, cream, or high-fat meats. Meal planning  Eat meals and snacks regularly, preferably at the same times every day. Avoid going long periods of time without eating.  Eat foods high in fiber, such as fresh fruits, vegetables, beans, and whole grains. Talk to your dietitian about how many servings of carbs you can eat at each meal.  Eat 4-6 ounces (oz) of lean protein each day, such as lean meat, chicken, fish, eggs, or tofu. One oz of lean protein is equal to: ? 1 oz of meat, chicken, or fish. ? 1 egg. ?  cup of tofu.  Eat some foods each day that contain healthy fats, such as avocado, nuts, seeds, and fish. Lifestyle  Check your blood glucose regularly.  Exercise regularly as told by your health care provider. This may include: ? 150 minutes of moderate-intensity or vigorous-intensity exercise each week. This could be  brisk walking, biking, or water aerobics. ? Stretching and doing strength exercises, such as yoga or weightlifting, at least 2 times a week.  Take medicines as told by your health care provider.  Do not use any products that contain nicotine or tobacco, such as cigarettes and e-cigarettes. If you need help quitting, ask your health care provider.  Work with a Social worker or diabetes educator to identify strategies to manage stress and any emotional and social challenges. Questions to ask a health care provider  Do I need to meet with a diabetes educator?  Do I need to meet with a dietitian?  What number can I call if I have questions?  When are the best times to check my blood glucose? Where to find more information:  American Diabetes Association: diabetes.org  Academy of Nutrition and Dietetics: www.eatright.[employer] of Diabetes and Digestive and Kidney Diseases (NIH): [employer] Summary  A healthy meal plan will help you control your blood glucose and maintain a healthy lifestyle.  Working with a diet and nutrition specialist (dietitian) can help you make a meal plan that is best for you.  Keep in mind that carbohydrates (carbs) and alcohol have immediate effects on your blood glucose levels. It is important to count carbs and to use alcohol carefully. This information is not intended to replace advice given to you by your health care provider. Make sure you discuss any questions you have with your health care provider. Document Released: 12/12/2004 Document Revised: 02/27/2017 Document Reviewed: 04/21/2016 Elsevier Patient Education  2020 Elsevier Inc.      Agustina Caroli, MD [facility]

## 2018-10-13 NOTE — Patient Instructions (Addendum)
   If you have lab work done today you will be contacted with your lab results within the next 2 weeks.  If you have not heard from us then please contact us. The fastest way to get your results is to register for My Chart.   IF you received an x-ray today, you will receive an invoice from [facility]. Please contact  Radiology at [phone] with questions or concerns regarding your invoice.   IF you received labwork today, you will receive an invoice from [facility]. Please contact [facility] at [phone] with questions or concerns regarding your invoice.   Our billing staff will not be able to assist you with questions regarding bills from these companies.  You will be contacted with the lab results as soon as they are available. The fastest way to get your results is to activate your My Chart account. Instructions are located on the last page of this paperwork. If you have not heard from us regarding the results in 2 weeks, please contact this office.     Diabetes Mellitus and Nutrition, Adult When you have diabetes (diabetes mellitus), it is very important to have healthy eating habits because your blood sugar (glucose) levels are greatly affected by what you eat and drink. Eating healthy foods in the appropriate amounts, at about the same times every day, can help you:  Control your blood glucose.  Lower your risk of heart disease.  Improve your blood pressure.  Reach or maintain a healthy weight. Every person with diabetes is different, and each person has different needs for a meal plan. Your health care provider may recommend that you work with a diet and nutrition specialist (dietitian) to make a meal plan that is best for you. Your meal plan may vary depending on factors such as:  The calories you need.  The medicines you take.  Your weight.  Your blood glucose, blood pressure, and cholesterol levels.  Your activity level.  Other health conditions  you have, such as heart or kidney disease. How do carbohydrates affect me? Carbohydrates, also called carbs, affect your blood glucose level more than any other type of food. Eating carbs naturally raises the amount of glucose in your blood. Carb counting is a method for keeping track of how many carbs you eat. Counting carbs is important to keep your blood glucose at a healthy level, especially if you use insulin or take certain oral diabetes medicines. It is important to know how many carbs you can safely have in each meal. This is different for every person. Your dietitian can help you calculate how many carbs you should have at each meal and for each snack. Foods that contain carbs include:  Bread, cereal, rice, pasta, and crackers.  Potatoes and corn.  Peas, beans, and lentils.  Milk and yogurt.  Fruit and juice.  Desserts, such as cakes, cookies, ice cream, and candy. How does alcohol affect me? Alcohol can cause a sudden decrease in blood glucose (hypoglycemia), especially if you use insulin or take certain oral diabetes medicines. Hypoglycemia can be a life-threatening condition. Symptoms of hypoglycemia (sleepiness, dizziness, and confusion) are similar to symptoms of having too much alcohol. If your health care provider says that alcohol is safe for you, follow these guidelines:  Limit alcohol intake to no more than 1 drink per day for nonpregnant women and 2 drinks per day for men. One drink equals 12 oz of beer, 5 oz of wine, or 1 oz of hard liquor.    Do not drink on an empty stomach.  Keep yourself hydrated with water, diet soda, or unsweetened iced tea.  Keep in mind that regular soda, juice, and other mixers may contain a lot of sugar and must be counted as carbs. What are tips for following this plan?  Reading food labels  Start by checking the serving size on the "Nutrition Facts" label of packaged foods and drinks. The amount of calories, carbs, fats, and other  nutrients listed on the label is based on one serving of the item. Many items contain more than one serving per package.  Check the total grams (g) of carbs in one serving. You can calculate the number of servings of carbs in one serving by dividing the total carbs by 15. For example, if a food has 30 g of total carbs, it would be equal to 2 servings of carbs.  Check the number of grams (g) of saturated and trans fats in one serving. Choose foods that have low or no amount of these fats.  Check the number of milligrams (mg) of salt (sodium) in one serving. Most people should limit total sodium intake to less than 2,300 mg per day.  Always check the nutrition information of foods labeled as "low-fat" or "nonfat". These foods may be higher in added sugar or refined carbs and should be avoided.  Talk to your dietitian to identify your daily goals for nutrients listed on the label. Shopping  Avoid buying canned, premade, or processed foods. These foods tend to be high in fat, sodium, and added sugar.  Shop around the outside edge of the grocery store. This includes fresh fruits and vegetables, bulk grains, fresh meats, and fresh dairy. Cooking  Use low-heat cooking methods, such as baking, instead of high-heat cooking methods like deep frying.  Cook using healthy oils, such as olive, canola, or sunflower oil.  Avoid cooking with butter, cream, or high-fat meats. Meal planning  Eat meals and snacks regularly, preferably at the same times every day. Avoid going long periods of time without eating.  Eat foods high in fiber, such as fresh fruits, vegetables, beans, and whole grains. Talk to your dietitian about how many servings of carbs you can eat at each meal.  Eat 4-6 ounces (oz) of lean protein each day, such as lean meat, chicken, fish, eggs, or tofu. One oz of lean protein is equal to: ? 1 oz of meat, chicken, or fish. ? 1 egg. ?  cup of tofu.  Eat some foods each day that contain  healthy fats, such as avocado, nuts, seeds, and fish. Lifestyle  Check your blood glucose regularly.  Exercise regularly as told by your health care provider. This may include: ? 150 minutes of moderate-intensity or vigorous-intensity exercise each week. This could be brisk walking, biking, or water aerobics. ? Stretching and doing strength exercises, such as yoga or weightlifting, at least 2 times a week.  Take medicines as told by your health care provider.  Do not use any products that contain nicotine or tobacco, such as cigarettes and e-cigarettes. If you need help quitting, ask your health care provider.  Work with a counselor or diabetes educator to identify strategies to manage stress and any emotional and social challenges. Questions to ask a health care provider  Do I need to meet with a diabetes educator?  Do I need to meet with a dietitian?  What number can I call if I have questions?  When are the best times to   check my blood glucose? Where to find more information:  American Diabetes Association: diabetes.org  Academy of Nutrition and Dietetics: www.eatright.org  National Institute of Diabetes and Digestive and Kidney Diseases (NIH): www.niddk.nih.gov Summary  A healthy meal plan will help you control your blood glucose and maintain a healthy lifestyle.  Working with a diet and nutrition specialist (dietitian) can help you make a meal plan that is best for you.  Keep in mind that carbohydrates (carbs) and alcohol have immediate effects on your blood glucose levels. It is important to count carbs and to use alcohol carefully. This information is not intended to replace advice given to you by your health care provider. Make sure you discuss any questions you have with your health care provider. Document Released: 12/12/2004 Document Revised: 02/27/2017 Document Reviewed: 04/21/2016 Elsevier Patient Education  2020 Elsevier Inc.  

## 2018-10-14 ENCOUNTER — Encounter: Payer: Self-pay | Admitting: Emergency Medicine

## 2018-10-14 LAB — COMPREHENSIVE METABOLIC PANEL
ALT: 24 IU/L (ref 0–44)
AST: 15 IU/L (ref 0–40)
Albumin/Globulin Ratio: 2 (ref 1.2–2.2)
Albumin: 4.5 g/dL (ref 3.8–4.8)
Alkaline Phosphatase: 86 IU/L (ref 39–117)
BUN/Creatinine Ratio: 18 (ref 10–24)
BUN: 16 mg/dL (ref 8–27)
Bilirubin Total: 0.5 mg/dL (ref 0.0–1.2)
CO2: 21 mmol/L (ref 20–29)
Calcium: 9.8 mg/dL (ref 8.6–10.2)
Chloride: 102 mmol/L (ref 96–106)
Creatinine, Ser: 0.9 mg/dL (ref 0.76–1.27)
GFR calc Af Amer: 104 mL/min/{1.73_m2} (ref >59)
GFR calc non Af Amer: 90 mL/min/{1.73_m2} (ref >59)
Globulin, Total: 2.3 g/dL (ref 1.5–4.5)
Glucose: 137 mg/dL — ABNORMAL HIGH (ref 65–99)
Potassium: 4.6 mmol/L (ref 3.5–5.2)
Sodium: 138 mmol/L (ref 134–144)
Total Protein: 6.8 g/dL (ref 6.0–8.5)

## 2018-10-14 LAB — LIPID PANEL
Chol/HDL Ratio: 4.3 ratio (ref 0.0–5.0)
Cholesterol, Total: 156 mg/dL (ref 100–199)
HDL: 36 mg/dL — ABNORMAL LOW (ref >39)
LDL Calculated: 92 mg/dL (ref 0–99)
Triglycerides: 141 mg/dL (ref 0–149)
VLDL Cholesterol Cal: 28 mg/dL (ref 5–40)

## 2018-10-14 LAB — MICROALBUMIN / CREATININE URINE RATIO
Creatinine, Urine: 82.7 mg/dL
Microalb/Creat Ratio: 23 mg/g creat (ref 0–29)
Microalbumin, Urine: 18.9 ug/mL

## 2018-10-14 LAB — HEMOGLOBIN A1C
Est. average glucose Bld gHb Est-mCnc: 174 mg/dL
Hgb A1c MFr Bld: 7.7 % — ABNORMAL HIGH (ref 4.8–5.6)

## 2019-01-05 ENCOUNTER — Encounter: Payer: Self-pay | Admitting: *Deleted

## 2019-01-10 ENCOUNTER — Encounter: Payer: Self-pay | Admitting: Emergency Medicine

## 2019-01-11 ENCOUNTER — Other Ambulatory Visit: Payer: Self-pay | Admitting: Emergency Medicine

## 2019-01-11 MED ORDER — JARDIANCE 10 MG PO TABS: 10.0000 mg | ORAL_TABLET | Freq: Every day | ORAL | 3 refills | Status: DC

## 2019-01-11 NOTE — Telephone Encounter (Signed)
Vania Rea will do.  Prescription sent.  Thanks.

## 2019-04-18 ENCOUNTER — Ambulatory Visit (INDEPENDENT_AMBULATORY_CARE_PROVIDER_SITE_OTHER): Payer: 59 | Admitting: Emergency Medicine

## 2019-04-18 ENCOUNTER — Other Ambulatory Visit: Payer: Self-pay

## 2019-04-18 ENCOUNTER — Encounter: Payer: Self-pay | Admitting: Emergency Medicine

## 2019-04-18 VITALS — BP 109/71 | HR 79 | Temp 98.7°F | Resp 16 | Ht 76.0 in | Wt 243.0 lb

## 2019-04-18 DIAGNOSIS — E1159 Type 2 diabetes mellitus with other circulatory complications: Secondary | ICD-10-CM

## 2019-04-18 DIAGNOSIS — Z23 Encounter for immunization: Secondary | ICD-10-CM

## 2019-04-18 DIAGNOSIS — E785 Hyperlipidemia, unspecified: Secondary | ICD-10-CM

## 2019-04-18 DIAGNOSIS — Z135 Encounter for screening for eye and ear disorders: Secondary | ICD-10-CM | POA: Diagnosis not present

## 2019-04-18 DIAGNOSIS — I10 Essential (primary) hypertension: Secondary | ICD-10-CM | POA: Diagnosis not present

## 2019-04-18 DIAGNOSIS — I152 Hypertension secondary to endocrine disorders: Secondary | ICD-10-CM

## 2019-04-18 DIAGNOSIS — Z1211 Encounter for screening for malignant neoplasm of colon: Secondary | ICD-10-CM | POA: Diagnosis not present

## 2019-04-18 DIAGNOSIS — E1169 Type 2 diabetes mellitus with other specified complication: Secondary | ICD-10-CM | POA: Diagnosis not present

## 2019-04-18 DIAGNOSIS — E782 Mixed hyperlipidemia: Secondary | ICD-10-CM

## 2019-04-18 LAB — POCT GLYCOSYLATED HEMOGLOBIN (HGB A1C): Hemoglobin A1C: 8.3 % — AB (ref 4.0–5.6)

## 2019-04-18 MED ORDER — TRULICITY 0.75 MG/0.5ML ~~LOC~~ SOAJ: 0.7500 mg | SUBCUTANEOUS | 6 refills | Status: DC

## 2019-04-18 NOTE — Patient Instructions (Addendum)
   If you have lab work done today you will be contacted with your lab results within the next 2 weeks.  If you have not heard from us then please contact us. The fastest way to get your results is to register for My Chart.   IF you received an x-ray today, you will receive an invoice from [facility]. Please contact [facility] at [phone] with questions or concerns regarding your invoice.   IF you received labwork today, you will receive an invoice from [facility]. Please contact [facility] at [phone] with questions or concerns regarding your invoice.   Our billing staff will not be able to assist you with questions regarding bills from these companies.  You will be contacted with the lab results as soon as they are available. The fastest way to get your results is to activate your My Chart account. Instructions are located on the last page of this paperwork. If you have not heard from us regarding the results in 2 weeks, please contact this office.      Diabetes Mellitus and Nutrition, Adult When you have diabetes (diabetes mellitus), it is very important to have healthy eating habits because your blood sugar (glucose) levels are greatly affected by what you eat and drink. Eating healthy foods in the appropriate amounts, at about the same times every day, can help you:  Control your blood glucose.  Lower your risk of heart disease.  Improve your blood pressure.  Reach or maintain a healthy weight. Every person with diabetes is different, and each person has different needs for a meal plan. Your health care provider may recommend that you work with a diet and nutrition specialist (dietitian) to make a meal plan that is best for you. Your meal plan may vary depending on factors such as:  The calories you need.  The medicines you take.  Your weight.  Your blood glucose, blood pressure, and cholesterol levels.  Your activity level.  Other health  conditions you have, such as heart or kidney disease. How do carbohydrates affect me? Carbohydrates, also called carbs, affect your blood glucose level more than any other type of food. Eating carbs naturally raises the amount of glucose in your blood. Carb counting is a method for keeping track of how many carbs you eat. Counting carbs is important to keep your blood glucose at a healthy level, especially if you use insulin or take certain oral diabetes medicines. It is important to know how many carbs you can safely have in each meal. This is different for every person. Your dietitian can help you calculate how many carbs you should have at each meal and for each snack. Foods that contain carbs include:  Bread, cereal, rice, pasta, and crackers.  Potatoes and corn.  Peas, beans, and lentils.  Milk and yogurt.  Fruit and juice.  Desserts, such as cakes, cookies, ice cream, and candy. How does alcohol affect me? Alcohol can cause a sudden decrease in blood glucose (hypoglycemia), especially if you use insulin or take certain oral diabetes medicines. Hypoglycemia can be a life-threatening condition. Symptoms of hypoglycemia (sleepiness, dizziness, and confusion) are similar to symptoms of having too much alcohol. If your health care provider says that alcohol is safe for you, follow these guidelines:  Limit alcohol intake to no more than 1 drink per day for nonpregnant women and 2 drinks per day for men. One drink equals 12 oz of beer, 5 oz of wine, or 1 oz of hard   liquor.  Do not drink on an empty stomach.  Keep yourself hydrated with water, diet soda, or unsweetened iced tea.  Keep in mind that regular soda, juice, and other mixers may contain a lot of sugar and must be counted as carbs. What are tips for following this plan?  Reading food labels  Start by checking the serving size on the "Nutrition Facts" label of packaged foods and drinks. The amount of calories, carbs, fats, and  other nutrients listed on the label is based on one serving of the item. Many items contain more than one serving per package.  Check the total grams (g) of carbs in one serving. You can calculate the number of servings of carbs in one serving by dividing the total carbs by 15. For example, if a food has 30 g of total carbs, it would be equal to 2 servings of carbs.  Check the number of grams (g) of saturated and trans fats in one serving. Choose foods that have low or no amount of these fats.  Check the number of milligrams (mg) of salt (sodium) in one serving. Most people should limit total sodium intake to less than 2,300 mg per day.  Always check the nutrition information of foods labeled as "low-fat" or "nonfat". These foods may be higher in added sugar or refined carbs and should be avoided.  Talk to your dietitian to identify your daily goals for nutrients listed on the label. Shopping  Avoid buying canned, premade, or processed foods. These foods tend to be high in fat, sodium, and added sugar.  Shop around the outside edge of the grocery store. This includes fresh fruits and vegetables, bulk grains, fresh meats, and fresh dairy. Cooking  Use low-heat cooking methods, such as baking, instead of high-heat cooking methods like deep frying.  Cook using healthy oils, such as olive, canola, or sunflower oil.  Avoid cooking with butter, cream, or high-fat meats. Meal planning  Eat meals and snacks regularly, preferably at the same times every day. Avoid going long periods of time without eating.  Eat foods high in fiber, such as fresh fruits, vegetables, beans, and whole grains. Talk to your dietitian about how many servings of carbs you can eat at each meal.  Eat 4-6 ounces (oz) of lean protein each day, such as lean meat, chicken, fish, eggs, or tofu. One oz of lean protein is equal to: ? 1 oz of meat, chicken, or fish. ? 1 egg. ?  cup of tofu.  Eat some foods each day that  contain healthy fats, such as avocado, nuts, seeds, and fish. Lifestyle  Check your blood glucose regularly.  Exercise regularly as told by your health care provider. This may include: ? 150 minutes of moderate-intensity or vigorous-intensity exercise each week. This could be brisk walking, biking, or water aerobics. ? Stretching and doing strength exercises, such as yoga or weightlifting, at least 2 times a week.  Take medicines as told by your health care provider.  Do not use any products that contain nicotine or tobacco, such as cigarettes and e-cigarettes. If you need help quitting, ask your health care provider.  Work with a counselor or diabetes educator to identify strategies to manage stress and any emotional and social challenges. Questions to ask a health care provider  Do I need to meet with a diabetes educator?  Do I need to meet with a dietitian?  What number can I call if I have questions?  When are the best   times to check my blood glucose? Where to find more information:  American Diabetes Association: diabetes.org  Academy of Nutrition and Dietetics: www.eatright.org  National Institute of Diabetes and Digestive and Kidney Diseases (NIH): www.niddk.nih.gov Summary  A healthy meal plan will help you control your blood glucose and maintain a healthy lifestyle.  Working with a diet and nutrition specialist (dietitian) can help you make a meal plan that is best for you.  Keep in mind that carbohydrates (carbs) and alcohol have immediate effects on your blood glucose levels. It is important to count carbs and to use alcohol carefully. This information is not intended to replace advice given to you by your health care provider. Make sure you discuss any questions you have with your health care provider. Document Revised: 02/27/2017 Document Reviewed: 04/21/2016 Elsevier Patient Education  2020 [employer]  

## 2019-04-18 NOTE — Assessment & Plan Note (Signed)
Well-controlled blood pressure continue quinapril 20 mg daily. Uncontrolled diabetes with hemoglobin A1c of 8.3, higher than before.  Continue metformin 1000 mg twice a day and Jardiance 10 mg daily.  Will add Trulicity 0.75 mg weekly.  Diet and nutrition discussed as well as increasing physical activity. Follow-up in 3 months.

## 2019-04-18 NOTE — Progress Notes (Signed)
Christian Harrington 64 y.o.   Chief Complaint  Patient presents with  . Diabetes    FOLLOW UP 6 MONTH    HISTORY OF PRESENT ILLNESS: This is a 64 y.o. male with history of diabetes dyslipidemia and hypertension here for follow-up. 1.  Diabetes: On Metformin and Jardiance.  Home glucose, high readings.  140s. Lab Results  Component Value Date   HGBA1C 7.7 (H) 10/13/2018    Wt Readings from Last 3 Encounters:  04/18/19 243 lb (110.2 kg)  10/13/18 235 lb (106.6 kg)  09/15/17 250 lb (113.4 kg)     2.  Dyslipidemia: On lovastatin 40 mg daily. Lab Results  Component Value Date   CHOL 156 10/13/2018   HDL 36 (L) 10/13/2018   LDLCALC 92 10/13/2018   TRIG 141 10/13/2018   CHOLHDL 4.3 10/13/2018    3.  Hypertension: On Accupril 20 mg daily. BP Readings from Last 3 Encounters:  04/18/19 109/71  10/13/18 123/76  09/15/17 (!) 143/75     Lab Results  Component Value Date   CREATININE 0.90 10/13/2018   BUN 16 10/13/2018   NA 138 10/13/2018   K 4.6 10/13/2018   CL 102 10/13/2018   CO2 21 10/13/2018   Has no complaints or medical concerns today.  Doing well.  HPI   Prior to Admission medications   Medication Sig Start Date End Date Taking? Authorizing Provider  aspirin 325 MG tablet Take 325 mg by mouth every other day.    Yes [provider]  Cholecalciferol (VITAMIN D3) 400 units CAPS Take 500 Units by mouth.   Yes [provider]  Cinnamon 500 MG TABS Take 1 tablet by mouth.   Yes [provider]  empagliflozin (JARDIANCE) 10 MG TABS tablet Take 10 mg by mouth daily.   Yes [provider]  Forskolin POWD by Does not apply route.   Yes [provider]  Garcinia Cambogia-Chromium 500-200 MG-MCG TABS Take 1 tablet by mouth.   Yes [provider]  lovastatin (MEVACOR) 40 MG tablet Take 1 tablet (40 mg total) by mouth at bedtime. 10/13/18  Yes Marvelous Woolford, Ines Bloomer, MD  metFORMIN (GLUCOPHAGE) 500 MG tablet Take 2 tablets  (1,000 mg total) by mouth 2 (two) times daily with a meal. 10/13/18  Yes Caedyn Raygoza, Ines Bloomer, MD  nabumetone (RELAFEN) 500 MG tablet Take 1 tablet (500 mg total) by mouth 2 (two) times daily as needed. 01/01/18  Yes [name], Legrand Como, MD  naproxen (NAPROSYN) 500 MG tablet Take 1 tablet (500 mg total) by mouth 2 (two) times daily with a meal. Take only as needed. 10/13/18  Yes Bulmaro Feagans, Ines Bloomer, MD  Omega-3 Fatty Acids (FISH OIL) 1000 MG CAPS Take 1 capsule by mouth.   Yes [provider]  quinapril (ACCUPRIL) 20 MG tablet Take 1 tablet (20 mg total) by mouth daily. 10/13/18  Yes Illeana Edick, Ines Bloomer, MD  tiZANidine (ZANAFLEX) 2 MG tablet Take 1-2 tablets (2-4 mg total) by mouth every 6 (six) hours as needed for muscle spasms. 01/01/18  Yes [name], Legrand Como, MD  blood glucose meter kit and supplies KIT Dispense per patient and insurance preference. Use up to four times daily FOR ICD-9 250.00, 250.01 06/11/16   Alveda Reasons, MD  glucose blood test strip Use as instructed 09/15/17   Jaynee Eagles, PA-C    No Known Allergies  Patient Active Problem List   Diagnosis Date Noted  . Dyslipidemia associated with type 2 diabetes mellitus ([place]) 03/29/2015  .  Hyperlipidemia 03/29/2015  . Hypertension associated with diabetes ([place]) 03/29/2015    Past Medical History:  Diagnosis Date  . Diabetes mellitus without complication [facility])     Past Surgical History:  Procedure Laterality Date  . CATARACT EXTRACTION Bilateral   . SHOULDER ARTHROSCOPY Right 2011  . TONSILLECTOMY      Social History   Socioeconomic History  . Marital status: Married    Spouse name: Not on file  . Number of children: 4  . Years of education: Not on file  . Highest education level: Not on file  Occupational History  . Occupation: Scientist, product/process development    Comment: Production designer, theatre/television/film  Tobacco Use  . Smoking status: Former Smoker    Quit date: 04/01/1991    Years since quitting: 28.0  . Smokeless tobacco: Never Used    Substance and Sexual Activity  . Alcohol use: No    Alcohol/week: 0.0 standard drinks  . Drug use: No  . Sexual activity: Not on file  Other Topics Concern  . Not on file  Social History Narrative   Lives with his wife, who is on disability.   4 adult children live independently, scattered across the country.   Social Determinants of Health   Financial Resource Strain:   . Difficulty of Paying Living Expenses: Not on file  Food Insecurity:   . Worried About Charity fundraiser in the Last Year: Not on file  . Ran Out of Food in the Last Year: Not on file  Transportation Needs:   . Lack of Transportation (Medical): Not on file  . Lack of Transportation (Non-Medical): Not on file  Physical Activity:   . Days of Exercise per Week: Not on file  . Minutes of Exercise per Session: Not on file  Stress:   . Feeling of Stress : Not on file  Social Connections:   . Frequency of Communication with Friends and Family: Not on file  . Frequency of Social Gatherings with Friends and Family: Not on file  . Attends Religious Services: Not on file  . Active Member of Clubs or Organizations: Not on file  . Attends Archivist Meetings: Not on file  . Marital Status: Not on file  Intimate Partner Violence:   . Fear of Current or Ex-Partner: Not on file  . Emotionally Abused: Not on file  . Physically Abused: Not on file  . Sexually Abused: Not on file    Family History  Problem Relation Age of Onset  . Heart disease Father 66       CHF  . Diabetes Sister   . Arthritis Mother        OA, DDD  . Osteoporosis Mother   . Diabetes Maternal Grandmother      Review of Systems  Constitutional: Negative.  Negative for chills and fever.  HENT: Negative.  Negative for congestion and sore throat.   Eyes: Negative.   Respiratory: Negative.  Negative for cough.   Cardiovascular: Negative.  Negative for chest pain and palpitations.  Gastrointestinal: Negative.  Negative for abdominal  pain, diarrhea, nausea and vomiting.  Genitourinary: Negative.   Musculoskeletal: Negative.  Negative for myalgias.  Skin: Negative.  Negative for rash.  Neurological: Negative for dizziness and headaches.  Endo/Heme/Allergies: Negative.   All other systems reviewed and are negative.   Today's Vitals   04/18/19 1056  BP: 109/71  Pulse: 79  Resp: 16  Temp: 98.7 F (37.1 C)  TempSrc: Temporal  SpO2: 95%  Weight: 243 lb (110.2 kg)  Height: 6' 4"  (1.93 m)   Body mass index is 29.58 kg/m.  Physical Exam Vitals reviewed.  Constitutional:      Appearance: Normal appearance.  HENT:     Head: Normocephalic.  Eyes:     Extraocular Movements: Extraocular movements intact.     Conjunctiva/sclera: Conjunctivae normal.     Pupils: Pupils are equal, round, and reactive to light.  Cardiovascular:     Rate and Rhythm: Normal rate and regular rhythm.     Pulses: Normal pulses.     Heart sounds: Normal heart sounds.  Pulmonary:     Effort: Pulmonary effort is normal.     Breath sounds: Normal breath sounds.  Musculoskeletal:        General: Normal range of motion.     Cervical back: Normal range of motion and neck supple.  Skin:    General: Skin is warm and dry.     Capillary Refill: Capillary refill takes less than 2 seconds.  Neurological:     General: No focal deficit present.     Mental Status: He is alert and oriented to person, place, and time.  Psychiatric:        Mood and Affect: Mood normal.        Behavior: Behavior normal.    Results for orders placed or performed in visit on 04/18/19 (from the past 24 hour(s))  POCT glycosylated hemoglobin (Hb A1C)     Status: Abnormal   Collection Time: 04/18/19 11:43 AM  Result Value Ref Range   Hemoglobin A1C 8.3 (A) 4.0 - 5.6 %   HbA1c POC (<> result, manual entry)     HbA1c, POC (prediabetic range)     HbA1c, POC (controlled diabetic range)     A total of 30 minutes was spent in the room with the patient, greater than 50%  of which was in counseling/coordination of care regarding diabetes and cardiovascular risks associated with it, treatment including present medications and new medications, hypoglycemia precautions, diet and nutrition, increasing physical activity, review of most recent blood work, prognosis and need for follow-up in 3 months.   ASSESSMENT & PLAN: Hypertension associated with diabetes ([place]) Well-controlled blood pressure continue quinapril 20 mg daily. Uncontrolled diabetes with hemoglobin A1c of 8.3, higher than before.  Continue metformin 1000 mg twice a day and Jardiance 10 mg daily.  Will add Trulicity 1.01 mg weekly.  Diet and nutrition discussed as well as increasing physical activity. Follow-up in 3 months.  Kelso was seen today for diabetes.  Diagnoses and all orders for this visit:  Hypertension associated with diabetes ([place]) -     Comprehensive metabolic panel -     POCT glycosylated hemoglobin (Hb A1C) -     Dulaglutide (TRULICITY) 7.51 WC/5.8NI SOPN; Inject 0.75 mg into the skin once a week.  Need for prophylactic vaccination against Streptococcus pneumoniae (pneumococcus) -     Pneumococcal polysaccharide vaccine 23-valent greater than or equal to 2yo subcutaneous/IM  Screening for diabetic retinopathy -     Ambulatory referral to Ophthalmology  Screening for colon cancer -     Cologuard  Mixed hyperlipidemia  Essential hypertension  Dyslipidemia associated with type 2 diabetes mellitus ([place]) -     Lipid Panel    Patient Instructions       If you have lab work done today you will be contacted with your lab results within the next 2 weeks.  If you have not heard from [place] then  please contact us. The fastest way to get your results is to register for My Chart.   IF you received an x-ray today, you will receive an invoice from [facility]. Please contact [facility] at [phone] with questions or concerns regarding your invoice.   IF you  received labwork today, you will receive an invoice from [facility]. Please contact [facility] at [phone] with questions or concerns regarding your invoice.   Our billing staff will not be able to assist you with questions regarding bills from these companies.  You will be contacted with the lab results as soon as they are available. The fastest way to get your results is to activate your My Chart account. Instructions are located on the last page of this paperwork. If you have not heard from [place] regarding the results in 2 weeks, please contact this office.     Diabetes Mellitus and Nutrition, Adult When you have diabetes (diabetes mellitus), it is very important to have healthy eating habits because your blood sugar (glucose) levels are greatly affected by what you eat and drink. Eating healthy foods in the appropriate amounts, at about the same times every day, can help you:  Control your blood glucose.  Lower your risk of heart disease.  Improve your blood pressure.  Reach or maintain a healthy weight. Every person with diabetes is different, and each person has different needs for a meal plan. Your health care provider may recommend that you work with a diet and nutrition specialist (dietitian) to make a meal plan that is best for you. Your meal plan may vary depending on factors such as:  The calories you need.  The medicines you take.  Your weight.  Your blood glucose, blood pressure, and cholesterol levels.  Your activity level.  Other health conditions you have, such as heart or kidney disease. How do carbohydrates affect me? Carbohydrates, also called carbs, affect your blood glucose level more than any other type of food. Eating carbs naturally raises the amount of glucose in your blood. Carb counting is a method for keeping track of how many carbs you eat. Counting carbs is important to keep your blood glucose at a healthy level, especially if you use insulin or take  certain oral diabetes medicines. It is important to know how many carbs you can safely have in each meal. This is different for every person. Your dietitian can help you calculate how many carbs you should have at each meal and for each snack. Foods that contain carbs include:  Bread, cereal, rice, pasta, and crackers.  Potatoes and corn.  Peas, beans, and lentils.  Milk and yogurt.  Fruit and juice.  Desserts, such as cakes, cookies, ice cream, and candy. How does alcohol affect me? Alcohol can cause a sudden decrease in blood glucose (hypoglycemia), especially if you use insulin or take certain oral diabetes medicines. Hypoglycemia can be a life-threatening condition. Symptoms of hypoglycemia (sleepiness, dizziness, and confusion) are similar to symptoms of having too much alcohol. If your health care provider says that alcohol is safe for you, follow these guidelines:  Limit alcohol intake to no more than 1 drink per day for nonpregnant women and 2 drinks per day for men. One drink equals 12 oz of beer, 5 oz of wine, or 1 oz of hard liquor.  Do not drink on an empty stomach.  Keep yourself hydrated with water, diet soda, or unsweetened iced tea.  Keep in mind that regular soda, juice, and other mixers  may contain a lot of sugar and must be counted as carbs. What are tips for following this plan?  Reading food labels  Start by checking the serving size on the "Nutrition Facts" label of packaged foods and drinks. The amount of calories, carbs, fats, and other nutrients listed on the label is based on one serving of the item. Many items contain more than one serving per package.  Check the total grams (g) of carbs in one serving. You can calculate the number of servings of carbs in one serving by dividing the total carbs by 15. For example, if a food has 30 g of total carbs, it would be equal to 2 servings of carbs.  Check the number of grams (g) of saturated and trans fats in one  serving. Choose foods that have low or no amount of these fats.  Check the number of milligrams (mg) of salt (sodium) in one serving. Most people should limit total sodium intake to less than 2,300 mg per day.  Always check the nutrition information of foods labeled as "low-fat" or "nonfat". These foods may be higher in added sugar or refined carbs and should be avoided.  Talk to your dietitian to identify your daily goals for nutrients listed on the label. Shopping  Avoid buying canned, premade, or processed foods. These foods tend to be high in fat, sodium, and added sugar.  Shop around the outside edge of the grocery store. This includes fresh fruits and vegetables, bulk grains, fresh meats, and fresh dairy. Cooking  Use low-heat cooking methods, such as baking, instead of high-heat cooking methods like deep frying.  Cook using healthy oils, such as olive, canola, or sunflower oil.  Avoid cooking with butter, cream, or high-fat meats. Meal planning  Eat meals and snacks regularly, preferably at the same times every day. Avoid going long periods of time without eating.  Eat foods high in fiber, such as fresh fruits, vegetables, beans, and whole grains. Talk to your dietitian about how many servings of carbs you can eat at each meal.  Eat 4-6 ounces (oz) of lean protein each day, such as lean meat, chicken, fish, eggs, or tofu. One oz of lean protein is equal to: ? 1 oz of meat, chicken, or fish. ? 1 egg. ?  cup of tofu.  Eat some foods each day that contain healthy fats, such as avocado, nuts, seeds, and fish. Lifestyle  Check your blood glucose regularly.  Exercise regularly as told by your health care provider. This may include: ? 150 minutes of moderate-intensity or vigorous-intensity exercise each week. This could be brisk walking, biking, or water aerobics. ? Stretching and doing strength exercises, such as yoga or weightlifting, at least 2 times a week.  Take medicines  as told by your health care provider.  Do not use any products that contain nicotine or tobacco, such as cigarettes and e-cigarettes. If you need help quitting, ask your health care provider.  Work with a Social worker or diabetes educator to identify strategies to manage stress and any emotional and social challenges. Questions to ask a health care provider  Do I need to meet with a diabetes educator?  Do I need to meet with a dietitian?  What number can I call if I have questions?  When are the best times to check my blood glucose? Where to find more information:  American Diabetes Association: diabetes.org  Academy of Nutrition and Dietetics: www.eatright.[employer] of Diabetes and Digestive and Kidney  Diseases (NIH): [employer] Summary  A healthy meal plan will help you control your blood glucose and maintain a healthy lifestyle.  Working with a diet and nutrition specialist (dietitian) can help you make a meal plan that is best for you.  Keep in mind that carbohydrates (carbs) and alcohol have immediate effects on your blood glucose levels. It is important to count carbs and to use alcohol carefully. This information is not intended to replace advice given to you by your health care provider. Make sure you discuss any questions you have with your health care provider. Document Revised: 02/27/2017 Document Reviewed: 04/21/2016 Elsevier Patient Education  2020 Elsevier Inc.      Agustina Caroli, MD [facility]

## 2019-04-19 ENCOUNTER — Telehealth: Payer: Self-pay | Admitting: *Deleted

## 2019-04-19 LAB — COMPREHENSIVE METABOLIC PANEL
ALT: 24 IU/L (ref 0–44)
AST: 18 IU/L (ref 0–40)
Albumin/Globulin Ratio: 2 (ref 1.2–2.2)
Albumin: 4.4 g/dL (ref 3.8–4.8)
Alkaline Phosphatase: 91 IU/L (ref 39–117)
BUN/Creatinine Ratio: 9 — ABNORMAL LOW (ref 10–24)
BUN: 8 mg/dL (ref 8–27)
Bilirubin Total: 0.4 mg/dL (ref 0.0–1.2)
CO2: 23 mmol/L (ref 20–29)
Calcium: 9.9 mg/dL (ref 8.6–10.2)
Chloride: 101 mmol/L (ref 96–106)
Creatinine, Ser: 0.87 mg/dL (ref 0.76–1.27)
GFR calc Af Amer: 105 mL/min/{1.73_m2} (ref >59)
GFR calc non Af Amer: 91 mL/min/{1.73_m2} (ref >59)
Globulin, Total: 2.2 g/dL (ref 1.5–4.5)
Glucose: 180 mg/dL — ABNORMAL HIGH (ref 65–99)
Potassium: 4.9 mmol/L (ref 3.5–5.2)
Sodium: 137 mmol/L (ref 134–144)
Total Protein: 6.6 g/dL (ref 6.0–8.5)

## 2019-04-19 LAB — LIPID PANEL
Chol/HDL Ratio: 4 ratio (ref 0.0–5.0)
Cholesterol, Total: 143 mg/dL (ref 100–199)
HDL: 36 mg/dL — ABNORMAL LOW (ref >39)
LDL Chol Calc (NIH): 84 mg/dL (ref 0–99)
Triglycerides: 125 mg/dL (ref 0–149)
VLDL Cholesterol Cal: 23 mg/dL (ref 5–40)

## 2019-04-19 NOTE — Telephone Encounter (Signed)
Faxed Rx request for One Touch Ultra Blue test strips to [facility]. Approved for total of one plus 11 additional refills. Confirmation page 1:21 pm.

## 2019-04-25 DIAGNOSIS — Z1211 Encounter for screening for malignant neoplasm of colon: Secondary | ICD-10-CM | POA: Diagnosis not present

## 2019-05-03 LAB — COLOGUARD: Cologuard: NEGATIVE

## 2019-05-21 ENCOUNTER — Ambulatory Visit: Payer: Self-pay | Attending: Internal Medicine

## 2019-05-21 DIAGNOSIS — Z23 Encounter for immunization: Secondary | ICD-10-CM | POA: Insufficient documentation

## 2019-05-21 NOTE — Progress Notes (Signed)
   Covid-19 Vaccination Clinic  Name:  Christian Harrington    MRN: [id number] DOB: 04/03/1955  05/21/2019  Christian Harrington was observed post Covid-19 immunization for 15 minutes without incidence. He was provided with Vaccine Information Sheet and instruction to access the V-Safe system.   Christian Harrington was instructed to call 911 with any severe reactions post vaccine: [name] Difficulty breathing  . Swelling of your face and throat  . A fast heartbeat  . A bad rash all over your body  . Dizziness and weakness    Immunizations Administered    Name Date Dose VIS Date Route   Pfizer COVID-19 Vaccine 05/21/2019  9:38 AM 0.3 mL 03/11/2019 Intramuscular   Manufacturer: [employer], [employer]   Lot: [id number]   NDC: [address]-3

## 2019-06-13 ENCOUNTER — Encounter: Payer: Self-pay | Admitting: Emergency Medicine

## 2019-06-13 ENCOUNTER — Other Ambulatory Visit: Payer: Self-pay | Admitting: Emergency Medicine

## 2019-06-13 DIAGNOSIS — I152 Hypertension secondary to endocrine disorders: Secondary | ICD-10-CM

## 2019-06-13 DIAGNOSIS — E1159 Type 2 diabetes mellitus with other circulatory complications: Secondary | ICD-10-CM

## 2019-06-13 MED ORDER — GLIPIZIDE 5 MG PO TABS: 5.0000 mg | ORAL_TABLET | Freq: Two times a day (BID) | ORAL | 3 refills | Status: DC

## 2019-06-13 NOTE — Telephone Encounter (Signed)
Too expensive. Will change to Glipizide 5mg  twice a day. Prescription sent. Thanks.

## 2019-06-13 NOTE — Telephone Encounter (Signed)
Pt has asked if theres a possible alternative to the Trulicity pens as they are $900 and the pt cannot afford the gap that the insurance will not cover please advise.

## 2019-06-14 ENCOUNTER — Ambulatory Visit: Payer: Self-pay | Attending: Internal Medicine

## 2019-06-14 DIAGNOSIS — Z23 Encounter for immunization: Secondary | ICD-10-CM

## 2019-06-14 NOTE — Progress Notes (Signed)
   Covid-19 Vaccination Clinic  Name:  KINNETH FUJIWARA    MRN: [id number] DOB: 1956-02-03  06/14/2019  Mr. Dismore was observed post Covid-19 immunization for 15 minutes without incident. He was provided with Vaccine Information Sheet and instruction to access the V-Safe system.   Mr. Montminy was instructed to call 911 with any severe reactions post vaccine: [name] Difficulty breathing  . Swelling of face and throat  . A fast heartbeat  . A bad rash all over body  . Dizziness and weakness   Immunizations Administered    Name Date Dose VIS Date Route   Pfizer COVID-19 Vaccine 06/14/2019  2:54 PM 0.3 mL 03/11/2019 Intramuscular   Manufacturer: [employer], [employer]   Lot: [id number]   NDC: [id number]

## 2019-07-19 ENCOUNTER — Other Ambulatory Visit: Payer: Self-pay

## 2019-07-19 ENCOUNTER — Encounter: Payer: Self-pay | Admitting: Emergency Medicine

## 2019-07-19 ENCOUNTER — Ambulatory Visit (INDEPENDENT_AMBULATORY_CARE_PROVIDER_SITE_OTHER): Payer: 59 | Admitting: Emergency Medicine

## 2019-07-19 VITALS — BP 107/69 | HR 79 | Temp 98.5°F | Resp 16 | Ht 76.0 in | Wt 242.0 lb

## 2019-07-19 DIAGNOSIS — E1159 Type 2 diabetes mellitus with other circulatory complications: Secondary | ICD-10-CM | POA: Diagnosis not present

## 2019-07-19 DIAGNOSIS — E785 Hyperlipidemia, unspecified: Secondary | ICD-10-CM | POA: Diagnosis not present

## 2019-07-19 DIAGNOSIS — I152 Hypertension secondary to endocrine disorders: Secondary | ICD-10-CM

## 2019-07-19 DIAGNOSIS — E1169 Type 2 diabetes mellitus with other specified complication: Secondary | ICD-10-CM

## 2019-07-19 DIAGNOSIS — I10 Essential (primary) hypertension: Secondary | ICD-10-CM | POA: Diagnosis not present

## 2019-07-19 LAB — POCT GLYCOSYLATED HEMOGLOBIN (HGB A1C): Hemoglobin A1C: 7.8 % — AB (ref 4.0–5.6)

## 2019-07-19 LAB — GLUCOSE, POCT (MANUAL RESULT ENTRY): POC Glucose: 97 mg/dl (ref 70–99)

## 2019-07-19 NOTE — Progress Notes (Signed)
Christian Harrington 64 y.o.   Chief Complaint  Patient presents with  . Diabetes    follow up 3 month    HISTORY OF PRESENT ILLNESS: This is a 64 y.o. male with history of diabetes, hypertension and dyslipidemia here for follow-up. 1.  Diabetes: Presently on Metformin and Jardiance.  Took Trulicity for 3 months but then insurance stopped coverage and he was started on glipizide 5 mg twice a day.  Patient's meal schedule is twice a day, 1-2 pm and 7-8pm.  Blood sugars at home between 90 and 140. Lab Results  Component Value Date   HGBA1C 8.3 (A) 04/18/2019    2.  Hypertension: On Accupril 20 mg daily BP Readings from Last 3 Encounters:  07/19/19 107/69  04/18/19 109/71  10/13/18 123/76    3.  Dyslipidemia: On lovastatin 40 mg daily. Lab Results  Component Value Date   CHOL 143 04/18/2019   HDL 36 (L) 04/18/2019   LDLCALC 84 04/18/2019   TRIG 125 04/18/2019   CHOLHDL 4.0 04/18/2019   Lab Results  Component Value Date   CREATININE 0.87 04/18/2019   BUN 8 04/18/2019   NA 137 04/18/2019   K 4.9 04/18/2019   CL 101 04/18/2019   CO2 23 04/18/2019    Other than medications high price and lack of coverage he has no complaints or medical concerns today. Fully vaccinated against Covid.  HPI   Prior to Admission medications   Medication Sig Start Date End Date Taking? Authorizing Provider  aspirin 325 MG tablet Take 325 mg by mouth every other day.    Yes [provider]  Cholecalciferol (VITAMIN D3) 400 units CAPS Take 500 Units by mouth.   Yes [provider]  Cinnamon 500 MG TABS Take 1 tablet by mouth.   Yes [provider]  empagliflozin (JARDIANCE) 10 MG TABS tablet Take 10 mg by mouth daily.   Yes [provider]  Forskolin POWD by Does not apply route.   Yes [provider]  Garcinia Cambogia-Chromium 500-200 MG-MCG TABS Take 1 tablet by mouth.   Yes [provider]  glipiZIDE (GLUCOTROL) 5 MG tablet Take 1 tablet  (5 mg total) by mouth 2 (two) times daily with a meal. 06/13/19 09/11/19 Yes Alishea Beaudin, Ines Bloomer, MD  lovastatin (MEVACOR) 40 MG tablet Take 1 tablet (40 mg total) by mouth at bedtime. 10/13/18  Yes Soua Lenk, Ines Bloomer, MD  metFORMIN (GLUCOPHAGE) 500 MG tablet Take 2 tablets (1,000 mg total) by mouth 2 (two) times daily with a meal. 10/13/18  Yes Ishia Tenorio, Ines Bloomer, MD  nabumetone (RELAFEN) 500 MG tablet Take 1 tablet (500 mg total) by mouth 2 (two) times daily as needed. 01/01/18  Yes [name], Legrand Como, MD  naproxen (NAPROSYN) 500 MG tablet Take 1 tablet (500 mg total) by mouth 2 (two) times daily with a meal. Take only as needed. 10/13/18  Yes Karalyn Kadel, Ines Bloomer, MD  Omega-3 Fatty Acids (FISH OIL) 1000 MG CAPS Take 1 capsule by mouth.   Yes [provider]  quinapril (ACCUPRIL) 20 MG tablet Take 1 tablet (20 mg total) by mouth daily. 10/13/18  Yes Keiara Sneeringer, Ines Bloomer, MD  tiZANidine (ZANAFLEX) 2 MG tablet Take 1-2 tablets (2-4 mg total) by mouth every 6 (six) hours as needed for muscle spasms. 01/01/18  Yes [name], Legrand Como, MD  blood glucose meter kit and supplies KIT Dispense per patient and insurance preference. Use up to four times daily FOR ICD-9 250.00, 250.01 06/11/16   Mingo Amber,  Kayleen Memos, MD  glucose blood test strip Use as instructed 09/15/17   Jaynee Eagles, PA-C    Not on File  Patient Active Problem List   Diagnosis Date Noted  . Dyslipidemia associated with type 2 diabetes mellitus ([place]) 03/29/2015  . Hyperlipidemia 03/29/2015  . Hypertension associated with diabetes ([place]) 03/29/2015    Past Medical History:  Diagnosis Date  . Diabetes mellitus without complication [facility])     Past Surgical History:  Procedure Laterality Date  . CATARACT EXTRACTION Bilateral   . SHOULDER ARTHROSCOPY Right 2011  . TONSILLECTOMY      Social History   Socioeconomic History  . Marital status: Married    Spouse name: Not on file  . Number of children: 4  . Years of education: Not on  file  . Highest education level: Not on file  Occupational History  . Occupation: Scientist, product/process development    Comment: Production designer, theatre/television/film  Tobacco Use  . Smoking status: Former Smoker    Quit date: 04/01/1991    Years since quitting: 28.3  . Smokeless tobacco: Never Used  Substance and Sexual Activity  . Alcohol use: No    Alcohol/week: 0.0 standard drinks  . Drug use: No  . Sexual activity: Not on file  Other Topics Concern  . Not on file  Social History Narrative   Lives with his wife, who is on disability.   4 adult children live independently, scattered across the country.   Social Determinants of Health   Financial Resource Strain:   . Difficulty of Paying Living Expenses:   Food Insecurity:   . Worried About Charity fundraiser in the Last Year:   . Arboriculturist in the Last Year:   Transportation Needs:   . Film/video editor (Medical):   [name] Lack of Transportation (Non-Medical):   Physical Activity:   . Days of Exercise per Week:   . Minutes of Exercise per Session:   Stress:   . Feeling of Stress :   Social Connections:   . Frequency of Communication with Friends and Family:   . Frequency of Social Gatherings with Friends and Family:   . Attends Religious Services:   . Active Member of Clubs or Organizations:   . Attends Archivist Meetings:   [name] Marital Status:   Intimate Partner Violence:   . Fear of Current or Ex-Partner:   . Emotionally Abused:   [name] Physically Abused:   . Sexually Abused:     Family History  Problem Relation Age of Onset  . Heart disease Father 64       CHF  . Diabetes Sister   . Arthritis Mother        OA, DDD  . Osteoporosis Mother   . Diabetes Maternal Grandmother      Review of Systems  Constitutional: Negative.  Negative for chills and fever.  HENT: Negative.  Negative for congestion and sore throat.   Respiratory: Negative.  Negative for cough and shortness of breath.   Cardiovascular: Negative.  Negative for  chest pain and palpitations.  Gastrointestinal: Negative.  Negative for abdominal pain, diarrhea, nausea and vomiting.  Genitourinary: Negative.  Negative for dysuria and hematuria.  Musculoskeletal: Negative for back pain, myalgias and neck pain.  Skin: Negative.  Negative for rash.  All other systems reviewed and are negative.  Today's Vitals   07/19/19 1405  BP: 107/69  Pulse: 79  Resp: 16  Temp: 98.5 F (36.9 C)  TempSrc: Temporal  SpO2: 94%  Weight: 242 lb (109.8 kg)  Height: 6' 4" (1.93 m)   Body mass index is 29.46 kg/m.   Physical Exam Vitals reviewed.  Constitutional:      Appearance: Normal appearance.  HENT:     Head: Normocephalic.  Eyes:     Extraocular Movements: Extraocular movements intact.     Conjunctiva/sclera: Conjunctivae normal.     Pupils: Pupils are equal, round, and reactive to light.  Cardiovascular:     Rate and Rhythm: Normal rate and regular rhythm.     Pulses: Normal pulses.     Heart sounds: Normal heart sounds.  Pulmonary:     Effort: Pulmonary effort is normal.     Breath sounds: Normal breath sounds.  Musculoskeletal:        General: Normal range of motion.     Cervical back: Normal range of motion and neck supple.  Skin:    General: Skin is warm and dry.     Capillary Refill: Capillary refill takes less than 2 seconds.  Neurological:     General: No focal deficit present.     Mental Status: He is alert and oriented to person, place, and time.  Psychiatric:        Mood and Affect: Mood normal.        Behavior: Behavior normal.    Results for orders placed or performed in visit on 07/19/19 (from the past 24 hour(s))  POCT glucose (manual entry)     Status: None   Collection Time: 07/19/19  2:43 PM  Result Value Ref Range   POC Glucose 97 70 - 99 mg/dl  POCT glycosylated hemoglobin (Hb A1C)     Status: Abnormal   Collection Time: 07/19/19  2:44 PM  Result Value Ref Range   Hemoglobin A1C 7.8 (A) 4.0 - 5.6 %   HbA1c POC (<>  result, manual entry)     HbA1c, POC (prediabetic range)     HbA1c, POC (controlled diabetic range)       ASSESSMENT & PLAN: Hypertension associated with diabetes (HCC) Well-controlled blood pressure.  Continue present medication.  No change. Hemoglobin A1c better than before at 7.8.  Continue metformin, Jardiance, and glipizide.  Given information about Rybelsus and patient will find out if it is covered by his insurance.  If it is we will start glucagon like peptide and take him off glipizide. Diet and nutrition discussed with patient as well as increasing amount of walking. Follow-up in 3 months.  Christian Harrington was seen today for diabetes.  Diagnoses and all orders for this visit:  Hypertension associated with diabetes (HCC) -     POCT glucose (manual entry) -     POCT glycosylated hemoglobin (Hb A1C) -     CBC with Differential/Platelet -     Comprehensive metabolic panel  Dyslipidemia associated with type 2 diabetes mellitus (HCC) -     Lipid panel    Patient Instructions       If you have lab work done today you will be contacted with your lab results within the next 2 weeks.  If you have not heard from us then please contact us. The fastest way to get your results is to register for My Chart.   IF you received an x-ray today, you will receive an invoice from [facility]. Please contact [facility] at [phone] with questions or concerns regarding your invoice.   IF you received labwork today, you will receive an invoice from   [facility]. Please contact [facility] at [phone] with questions or concerns regarding your invoice.   Our billing staff will not be able to assist you with questions regarding bills from these companies.  You will be contacted with the lab results as soon as they are available. The fastest way to get your results is to activate your My Chart account. Instructions are located on the last page of this paperwork. If you have not heard  from [place] regarding the results in 2 weeks, please contact this office.     Diabetes Mellitus and Nutrition, Adult When you have diabetes (diabetes mellitus), it is very important to have healthy eating habits because your blood sugar (glucose) levels are greatly affected by what you eat and drink. Eating healthy foods in the appropriate amounts, at about the same times every day, can help you:  Control your blood glucose.  Lower your risk of heart disease.  Improve your blood pressure.  Reach or maintain a healthy weight. Every person with diabetes is different, and each person has different needs for a meal plan. Your health care provider may recommend that you work with a diet and nutrition specialist (dietitian) to make a meal plan that is best for you. Your meal plan may vary depending on factors such as:  The calories you need.  The medicines you take.  Your weight.  Your blood glucose, blood pressure, and cholesterol levels.  Your activity level.  Other health conditions you have, such as heart or kidney disease. How do carbohydrates affect me? Carbohydrates, also called carbs, affect your blood glucose level more than any other type of food. Eating carbs naturally raises the amount of glucose in your blood. Carb counting is a method for keeping track of how many carbs you eat. Counting carbs is important to keep your blood glucose at a healthy level, especially if you use insulin or take certain oral diabetes medicines. It is important to know how many carbs you can safely have in each meal. This is different for every person. Your dietitian can help you calculate how many carbs you should have at each meal and for each snack. Foods that contain carbs include:  Bread, cereal, rice, pasta, and crackers.  Potatoes and corn.  Peas, beans, and lentils.  Milk and yogurt.  Fruit and juice.  Desserts, such as cakes, cookies, ice cream, and candy. How does alcohol affect  me? Alcohol can cause a sudden decrease in blood glucose (hypoglycemia), especially if you use insulin or take certain oral diabetes medicines. Hypoglycemia can be a life-threatening condition. Symptoms of hypoglycemia (sleepiness, dizziness, and confusion) are similar to symptoms of having too much alcohol. If your health care provider says that alcohol is safe for you, follow these guidelines:  Limit alcohol intake to no more than 1 drink per day for nonpregnant women and 2 drinks per day for men. One drink equals 12 oz of beer, 5 oz of wine, or 1 oz of hard liquor.  Do not drink on an empty stomach.  Keep yourself hydrated with water, diet soda, or unsweetened iced tea.  Keep in mind that regular soda, juice, and other mixers may contain a lot of sugar and must be counted as carbs. What are tips for following this plan?  Reading food labels  Start by checking the serving size on the "Nutrition Facts" label of packaged foods and drinks. The amount of calories, carbs, fats, and other nutrients listed on the label is based on one serving  of the item. Many items contain more than one serving per package.  Check the total grams (g) of carbs in one serving. You can calculate the number of servings of carbs in one serving by dividing the total carbs by 15. For example, if a food has 30 g of total carbs, it would be equal to 2 servings of carbs.  Check the number of grams (g) of saturated and trans fats in one serving. Choose foods that have low or no amount of these fats.  Check the number of milligrams (mg) of salt (sodium) in one serving. Most people should limit total sodium intake to less than 2,300 mg per day.  Always check the nutrition information of foods labeled as "low-fat" or "nonfat". These foods may be higher in added sugar or refined carbs and should be avoided.  Talk to your dietitian to identify your daily goals for nutrients listed on the label. Shopping  Avoid buying  canned, premade, or processed foods. These foods tend to be high in fat, sodium, and added sugar.  Shop around the outside edge of the grocery store. This includes fresh fruits and vegetables, bulk grains, fresh meats, and fresh dairy. Cooking  Use low-heat cooking methods, such as baking, instead of high-heat cooking methods like deep frying.  Cook using healthy oils, such as olive, canola, or sunflower oil.  Avoid cooking with butter, cream, or high-fat meats. Meal planning  Eat meals and snacks regularly, preferably at the same times every day. Avoid going long periods of time without eating.  Eat foods high in fiber, such as fresh fruits, vegetables, beans, and whole grains. Talk to your dietitian about how many servings of carbs you can eat at each meal.  Eat 4-6 ounces (oz) of lean protein each day, such as lean meat, chicken, fish, eggs, or tofu. One oz of lean protein is equal to: ? 1 oz of meat, chicken, or fish. ? 1 egg. ?  cup of tofu.  Eat some foods each day that contain healthy fats, such as avocado, nuts, seeds, and fish. Lifestyle  Check your blood glucose regularly.  Exercise regularly as told by your health care provider. This may include: ? 150 minutes of moderate-intensity or vigorous-intensity exercise each week. This could be brisk walking, biking, or water aerobics. ? Stretching and doing strength exercises, such as yoga or weightlifting, at least 2 times a week.  Take medicines as told by your health care provider.  Do not use any products that contain nicotine or tobacco, such as cigarettes and e-cigarettes. If you need help quitting, ask your health care provider.  Work with a counselor or diabetes educator to identify strategies to manage stress and any emotional and social challenges. Questions to ask a health care provider  Do I need to meet with a diabetes educator?  Do I need to meet with a dietitian?  What number can I call if I have  questions?  When are the best times to check my blood glucose? Where to find more information:  American Diabetes Association: diabetes.org  Academy of Nutrition and Dietetics: www.eatright.org  National Institute of Diabetes and Digestive and Kidney Diseases (NIH): www.niddk.nih.gov Summary  A healthy meal plan will help you control your blood glucose and maintain a healthy lifestyle.  Working with a diet and nutrition specialist (dietitian) can help you make a meal plan that is best for you.  Keep in mind that carbohydrates (carbs) and alcohol have immediate effects on your blood glucose   levels. It is important to count carbs and to use alcohol carefully. This information is not intended to replace advice given to you by your health care provider. Make sure you discuss any questions you have with your health care provider. Document Revised: 02/27/2017 Document Reviewed: 04/21/2016 Elsevier Patient Education  2020 Elsevier Inc.      Miguel Sagardia, MD [facility] 

## 2019-07-19 NOTE — Patient Instructions (Addendum)
   If you have lab work done today you will be contacted with your lab results within the next 2 weeks.  If you have not heard from us then please contact us. The fastest way to get your results is to register for My Chart.   IF you received an x-ray today, you will receive an invoice from [facility]. Please contact [facility] at [phone] with questions or concerns regarding your invoice.   IF you received labwork today, you will receive an invoice from [facility]. Please contact [facility] at [phone] with questions or concerns regarding your invoice.   Our billing staff will not be able to assist you with questions regarding bills from these companies.  You will be contacted with the lab results as soon as they are available. The fastest way to get your results is to activate your My Chart account. Instructions are located on the last page of this paperwork. If you have not heard from us regarding the results in 2 weeks, please contact this office.      Diabetes Mellitus and Nutrition, Adult When you have diabetes (diabetes mellitus), it is very important to have healthy eating habits because your blood sugar (glucose) levels are greatly affected by what you eat and drink. Eating healthy foods in the appropriate amounts, at about the same times every day, can help you:  Control your blood glucose.  Lower your risk of heart disease.  Improve your blood pressure.  Reach or maintain a healthy weight. Every person with diabetes is different, and each person has different needs for a meal plan. Your health care provider may recommend that you work with a diet and nutrition specialist (dietitian) to make a meal plan that is best for you. Your meal plan may vary depending on factors such as:  The calories you need.  The medicines you take.  Your weight.  Your blood glucose, blood pressure, and cholesterol levels.  Your activity level.  Other health  conditions you have, such as heart or kidney disease. How do carbohydrates affect me? Carbohydrates, also called carbs, affect your blood glucose level more than any other type of food. Eating carbs naturally raises the amount of glucose in your blood. Carb counting is a method for keeping track of how many carbs you eat. Counting carbs is important to keep your blood glucose at a healthy level, especially if you use insulin or take certain oral diabetes medicines. It is important to know how many carbs you can safely have in each meal. This is different for every person. Your dietitian can help you calculate how many carbs you should have at each meal and for each snack. Foods that contain carbs include:  Bread, cereal, rice, pasta, and crackers.  Potatoes and corn.  Peas, beans, and lentils.  Milk and yogurt.  Fruit and juice.  Desserts, such as cakes, cookies, ice cream, and candy. How does alcohol affect me? Alcohol can cause a sudden decrease in blood glucose (hypoglycemia), especially if you use insulin or take certain oral diabetes medicines. Hypoglycemia can be a life-threatening condition. Symptoms of hypoglycemia (sleepiness, dizziness, and confusion) are similar to symptoms of having too much alcohol. If your health care provider says that alcohol is safe for you, follow these guidelines:  Limit alcohol intake to no more than 1 drink per day for nonpregnant women and 2 drinks per day for men. One drink equals 12 oz of beer, 5 oz of wine, or 1 oz of hard   liquor.  Do not drink on an empty stomach.  Keep yourself hydrated with water, diet soda, or unsweetened iced tea.  Keep in mind that regular soda, juice, and other mixers may contain a lot of sugar and must be counted as carbs. What are tips for following this plan?  Reading food labels  Start by checking the serving size on the "Nutrition Facts" label of packaged foods and drinks. The amount of calories, carbs, fats, and  other nutrients listed on the label is based on one serving of the item. Many items contain more than one serving per package.  Check the total grams (g) of carbs in one serving. You can calculate the number of servings of carbs in one serving by dividing the total carbs by 15. For example, if a food has 30 g of total carbs, it would be equal to 2 servings of carbs.  Check the number of grams (g) of saturated and trans fats in one serving. Choose foods that have low or no amount of these fats.  Check the number of milligrams (mg) of salt (sodium) in one serving. Most people should limit total sodium intake to less than 2,300 mg per day.  Always check the nutrition information of foods labeled as "low-fat" or "nonfat". These foods may be higher in added sugar or refined carbs and should be avoided.  Talk to your dietitian to identify your daily goals for nutrients listed on the label. Shopping  Avoid buying canned, premade, or processed foods. These foods tend to be high in fat, sodium, and added sugar.  Shop around the outside edge of the grocery store. This includes fresh fruits and vegetables, bulk grains, fresh meats, and fresh dairy. Cooking  Use low-heat cooking methods, such as baking, instead of high-heat cooking methods like deep frying.  Cook using healthy oils, such as olive, canola, or sunflower oil.  Avoid cooking with butter, cream, or high-fat meats. Meal planning  Eat meals and snacks regularly, preferably at the same times every day. Avoid going long periods of time without eating.  Eat foods high in fiber, such as fresh fruits, vegetables, beans, and whole grains. Talk to your dietitian about how many servings of carbs you can eat at each meal.  Eat 4-6 ounces (oz) of lean protein each day, such as lean meat, chicken, fish, eggs, or tofu. One oz of lean protein is equal to: ? 1 oz of meat, chicken, or fish. ? 1 egg. ?  cup of tofu.  Eat some foods each day that  contain healthy fats, such as avocado, nuts, seeds, and fish. Lifestyle  Check your blood glucose regularly.  Exercise regularly as told by your health care provider. This may include: ? 150 minutes of moderate-intensity or vigorous-intensity exercise each week. This could be brisk walking, biking, or water aerobics. ? Stretching and doing strength exercises, such as yoga or weightlifting, at least 2 times a week.  Take medicines as told by your health care provider.  Do not use any products that contain nicotine or tobacco, such as cigarettes and e-cigarettes. If you need help quitting, ask your health care provider.  Work with a counselor or diabetes educator to identify strategies to manage stress and any emotional and social challenges. Questions to ask a health care provider  Do I need to meet with a diabetes educator?  Do I need to meet with a dietitian?  What number can I call if I have questions?  When are the best   times to check my blood glucose? Where to find more information:  American Diabetes Association: diabetes.org  Academy of Nutrition and Dietetics: www.eatright.org  National Institute of Diabetes and Digestive and Kidney Diseases (NIH): www.niddk.nih.gov Summary  A healthy meal plan will help you control your blood glucose and maintain a healthy lifestyle.  Working with a diet and nutrition specialist (dietitian) can help you make a meal plan that is best for you.  Keep in mind that carbohydrates (carbs) and alcohol have immediate effects on your blood glucose levels. It is important to count carbs and to use alcohol carefully. This information is not intended to replace advice given to you by your health care provider. Make sure you discuss any questions you have with your health care provider. Document Revised: 02/27/2017 Document Reviewed: 04/21/2016 Elsevier Patient Education  2020 Elsevier Inc.  

## 2019-07-19 NOTE — Assessment & Plan Note (Signed)
Well-controlled blood pressure.  Continue present medication.  No change. Hemoglobin A1c better than before at 7.8.  Continue metformin, Jardiance, and glipizide.  Given information about Rybelsus and patient will find out if it is covered by his insurance.  If it is we will start glucagon like peptide and take him off glipizide. Diet and nutrition discussed with patient as well as increasing amount of walking. Follow-up in 3 months.

## 2019-07-20 ENCOUNTER — Encounter: Payer: Self-pay | Admitting: Emergency Medicine

## 2019-07-20 LAB — CBC WITH DIFFERENTIAL/PLATELET
Basophils Absolute: 0.1 10*3/uL (ref 0.0–0.2)
Basos: 1 %
EOS (ABSOLUTE): 0.1 10*3/uL (ref 0.0–0.4)
Eos: 1 %
Hematocrit: 48.3 % (ref 37.5–51.0)
Hemoglobin: 16.4 g/dL (ref 13.0–17.7)
Immature Grans (Abs): 0 10*3/uL (ref 0.0–0.1)
Immature Granulocytes: 0 %
Lymphocytes Absolute: 2.4 10*3/uL (ref 0.7–3.1)
Lymphs: 35 %
MCH: 28.2 pg (ref 26.6–33.0)
MCHC: 34 g/dL (ref 31.5–35.7)
MCV: 83 fL (ref 79–97)
Monocytes Absolute: 0.6 10*3/uL (ref 0.1–0.9)
Monocytes: 9 %
Neutrophils Absolute: 3.7 10*3/uL (ref 1.4–7.0)
Neutrophils: 54 %
Platelets: 208 10*3/uL (ref 150–450)
RBC: 5.81 x10E6/uL — ABNORMAL HIGH (ref 4.14–5.80)
RDW: 14.1 % (ref 11.6–15.4)
WBC: 6.8 10*3/uL (ref 3.4–10.8)

## 2019-07-20 LAB — COMPREHENSIVE METABOLIC PANEL
ALT: 27 IU/L (ref 0–44)
AST: 21 IU/L (ref 0–40)
Albumin/Globulin Ratio: 2 (ref 1.2–2.2)
Albumin: 4.5 g/dL (ref 3.8–4.8)
Alkaline Phosphatase: 83 IU/L (ref 39–117)
BUN/Creatinine Ratio: 14 (ref 10–24)
BUN: 10 mg/dL (ref 8–27)
Bilirubin Total: 0.5 mg/dL (ref 0.0–1.2)
CO2: 20 mmol/L (ref 20–29)
Calcium: 9.8 mg/dL (ref 8.6–10.2)
Chloride: 106 mmol/L (ref 96–106)
Creatinine, Ser: 0.74 mg/dL — ABNORMAL LOW (ref 0.76–1.27)
GFR calc Af Amer: 112 mL/min/{1.73_m2} (ref >59)
GFR calc non Af Amer: 97 mL/min/{1.73_m2} (ref >59)
Globulin, Total: 2.3 g/dL (ref 1.5–4.5)
Glucose: 100 mg/dL — ABNORMAL HIGH (ref 65–99)
Potassium: 4.2 mmol/L (ref 3.5–5.2)
Sodium: 140 mmol/L (ref 134–144)
Total Protein: 6.8 g/dL (ref 6.0–8.5)

## 2019-07-20 LAB — LIPID PANEL
Chol/HDL Ratio: 3.9 ratio (ref 0.0–5.0)
Cholesterol, Total: 144 mg/dL (ref 100–199)
HDL: 37 mg/dL — ABNORMAL LOW (ref >39)
LDL Chol Calc (NIH): 87 mg/dL (ref 0–99)
Triglycerides: 107 mg/dL (ref 0–149)
VLDL Cholesterol Cal: 20 mg/dL (ref 5–40)

## 2019-08-02 DIAGNOSIS — E119 Type 2 diabetes mellitus without complications: Secondary | ICD-10-CM | POA: Diagnosis not present

## 2019-08-02 DIAGNOSIS — Z961 Presence of intraocular lens: Secondary | ICD-10-CM | POA: Diagnosis not present

## 2019-08-02 DIAGNOSIS — H524 Presbyopia: Secondary | ICD-10-CM | POA: Diagnosis not present

## 2019-08-02 DIAGNOSIS — E7800 Pure hypercholesterolemia, unspecified: Secondary | ICD-10-CM | POA: Diagnosis not present

## 2019-08-02 LAB — HM DIABETES EYE EXAM

## 2019-08-09 ENCOUNTER — Encounter: Payer: Self-pay | Admitting: *Deleted

## 2019-10-05 ENCOUNTER — Ambulatory Visit: Payer: 59 | Admitting: Emergency Medicine

## 2019-10-05 ENCOUNTER — Other Ambulatory Visit: Payer: Self-pay | Admitting: Emergency Medicine

## 2019-10-05 DIAGNOSIS — E119 Type 2 diabetes mellitus without complications: Secondary | ICD-10-CM

## 2019-10-06 ENCOUNTER — Other Ambulatory Visit: Payer: Self-pay

## 2019-10-06 ENCOUNTER — Encounter: Payer: Self-pay | Admitting: Emergency Medicine

## 2019-10-06 ENCOUNTER — Ambulatory Visit (INDEPENDENT_AMBULATORY_CARE_PROVIDER_SITE_OTHER): Payer: 59 | Admitting: Emergency Medicine

## 2019-10-06 VITALS — BP 116/76 | HR 73 | Temp 97.9°F | Resp 16 | Ht 76.0 in | Wt 246.0 lb

## 2019-10-06 DIAGNOSIS — H0100B Unspecified blepharitis left eye, upper and lower eyelids: Secondary | ICD-10-CM | POA: Diagnosis not present

## 2019-10-06 DIAGNOSIS — I10 Essential (primary) hypertension: Secondary | ICD-10-CM

## 2019-10-06 DIAGNOSIS — E785 Hyperlipidemia, unspecified: Secondary | ICD-10-CM | POA: Diagnosis not present

## 2019-10-06 DIAGNOSIS — I152 Hypertension secondary to endocrine disorders: Secondary | ICD-10-CM

## 2019-10-06 DIAGNOSIS — E1159 Type 2 diabetes mellitus with other circulatory complications: Secondary | ICD-10-CM | POA: Diagnosis not present

## 2019-10-06 DIAGNOSIS — E1169 Type 2 diabetes mellitus with other specified complication: Secondary | ICD-10-CM | POA: Diagnosis not present

## 2019-10-06 DIAGNOSIS — H1032 Unspecified acute conjunctivitis, left eye: Secondary | ICD-10-CM

## 2019-10-06 MED ORDER — CEFADROXIL 500 MG PO CAPS: 500.0000 mg | ORAL_CAPSULE | Freq: Two times a day (BID) | ORAL | 0 refills | Status: AC

## 2019-10-06 MED ORDER — TOBRAMYCIN 0.3 % OP SOLN: 2.0000 [drp] | Freq: Three times a day (TID) | OPHTHALMIC | 1 refills | Status: AC

## 2019-10-06 NOTE — Patient Instructions (Addendum)
If you have lab work done today you will be contacted with your lab results within the next 2 weeks.  If you have not heard from [place] then please contact us. The fastest way to get your results is to register for My Chart.   IF you received an x-ray today, you will receive an invoice from [facility]. Please contact [facility] at [phone] with questions or concerns regarding your invoice.   IF you received labwork today, you will receive an invoice from [facility]. Please contact [facility] at [phone] with questions or concerns regarding your invoice.   Our billing staff will not be able to assist you with questions regarding bills from these companies.  You will be contacted with the lab results as soon as they are available. The fastest way to get your results is to activate your My Chart account. Instructions are located on the last page of this paperwork. If you have not heard from [place] regarding the results in 2 weeks, please contact this office.     Blepharitis Blepharitis is swelling of the eyelids. Symptoms may include:  Reddish, scaly skin around the scalp and eyebrows.  Burning or itching of the eyelids.  Fluid coming from the eye at night. This causes the eyelashes to stick together in the morning.  Eyelashes that fall out.  Being sensitive to light. Follow these instructions at home: Pay attention to any changes in how you look or feel. Tell your health care provider about any changes. Follow these instructions to help with your condition: Keeping clean   Wash your hands often.  Wash your eyelids with warm water, or wash them with warm water that is mixed with little bit of baby shampoo. Do this 2 or more times per day.  Wash your face and eyebrows at least once a day.  Use a clean towel each time you dry your eyelids. Do not use the towel to clean or dry other areas of your body. Do not share your towel with anyone. General  instructions  Avoid wearing makeup until you get better. Do not share makeup with anyone.  Avoid rubbing your eyes.  Put a warm compress on your eyes 2 times per day for 10 minutes at a time, or as told by your doctor.  If you were given antibiotics in the form of creams or eye drops, use the medicine as told by your doctor. Do not stop using the medicine even if you feel better.  Keep all follow-up visits as told by your doctor. This is important. Contact a doctor if:  Your eyelids feel hot.  You have blisters on your eyelids.  You have a rash on your eyelids.  The swelling does not go away in 2-4 days.  The swelling gets worse. Get help right away if:  You have pain that gets worse.  You have pain that spreads to other parts of your face.  You have redness that gets worse.  You have redness that spreads to other parts of your face.  Your vision changes.  You have pain when you look at lights or things that move.  You have a fever. Summary  Blepharitis is swelling of the eyelids.  Pay attention to any changes in how your eyes look or feel. Tell your doctor about any changes.  Follow home care instructions as told by your doctor. Wash your hands often. Avoid wearing makeup. Do not rub your eyes.  Use warm compresses, creams, or  eye drops as told by your doctor.  Let your doctor know if you have changes in vision, blisters or rash on eyelids, pain that spreads to your face, or warmth on your eyelids. This information is not intended to replace advice given to you by your health care provider. Make sure you discuss any questions you have with your health care provider. Document Revised: 09/14/2017 Document Reviewed: 09/14/2017 Elsevier Patient Education  2020 Else[employer] Inc.  Bacterial Conjunctivitis, Adult Bacterial conjunctivitis is an infection of your conjunctiva. This is the clear membrane that covers the white part of your eye and the inner part of your eyelid.  This infection can make your eye:  Red or pink.  Itchy. This condition spreads easily from person to person (is contagious) and from one eye to the other eye. What are the causes?  This condition is caused by germs (bacteria). You may get the infection if you come into close contact with: ? A person who has the infection. ? Items that have germs on them (are contaminated), such as face towels, contact lens solution, or eye makeup. What increases the risk? You are more likely to get this condition if you:  Have contact with people who have the infection.  Wear contact lenses.  Have a sinus infection.  Have had a recent eye injury or surgery.  Have a weak body defense system (immune system).  Have dry eyes. What are the signs or symptoms?   Thick, yellowish discharge from the eye.  Tearing or watery eyes.  Itchy eyes.  Burning feeling in your eyes.  Eye redness.  Swollen eyelids.  Blurred vision. How is this treated?   Antibiotic eye drops or ointment.  Antibiotic medicine taken by mouth. This is used for infections that do not get better with drops or ointment or that last more than 10 days.  Cool, wet cloths placed on the eyes.  Artificial tears used 2-6 times a day. Follow these instructions at home: Medicines  Take or apply your antibiotic medicine as told by your doctor. Do not stop taking or applying the antibiotic even if you start to feel better.  Take or apply over-the-counter and prescription medicines only as told by your doctor.  Do not touch your eyelid with the eye-drop bottle or the ointment tube. Managing discomfort  Wipe any fluid from your eye with a warm, wet washcloth or a cotton ball.  Place a clean, cool, wet cloth on your eye. Do this for 10-20 minutes, 3-4 times per day. General instructions  Do not wear contacts until the infection is gone. Wear glasses until your doctor says it is okay to wear contacts again.  Do not wear eye  makeup until the infection is gone. Throw away old eye makeup.  Change or wash your pillowcase every day.  Do not share towels or washcloths.  Wash your hands often with soap and water. Use paper towels to dry your hands.  Do not touch or rub your eyes.  Do not drive or use heavy machinery if your vision is blurred. Contact a doctor if:  You have a fever.  You do not get better after 10 days. Get help right away if:  You have a fever and your symptoms get worse all of a sudden.  You have very bad pain when you move your eye.  Your face: ? Hurts. ? Is red. ? Is swollen.  You have sudden loss of vision. Summary  Bacterial conjunctivitis is an infection of  your conjunctiva.  This infection spreads easily from person to person.  Wash your hands often with soap and water. Use paper towels to dry your hands.  Take or apply your antibiotic medicine as told by your doctor.  Contact a doctor if you have a fever or you do not get better after 10 days. This information is not intended to replace advice given to you by your health care provider. Make sure you discuss any questions you have with your health care provider. Document Revised: 07/06/2018 Document Reviewed: 10/21/2017 Elsevier Patient Education  2020 [employer].

## 2019-10-06 NOTE — Progress Notes (Signed)
Christian Harrington 64 y.o.   Chief Complaint  Patient presents with  . Eye Problem    RIGHT eye irritation for 2 weeks with crusty mucus    HISTORY OF PRESENT ILLNESS: This is a 63 y.o. male with history of diabetes complaining of left thigh infection for the past 2 weeks.  Crusty discharge mostly in the mornings with occasional irritation but denies pain or discomfort.  Eyelids are also red and swollen.  No visual changes.  No other significant symptoms. Lab Results  Component Value Date   HGBA1C 7.8 (A) 07/19/2019  No other complaints or medical concerns today. BP Readings from Last 3 Encounters:  10/06/19 116/76  07/19/19 107/69  04/18/19 109/71    HPI   Prior to Admission medications   Medication Sig Start Date End Date Taking? Authorizing Provider  aspirin 325 MG tablet Take 325 mg by mouth every other day.     [provider]  blood glucose meter kit and supplies KIT Dispense per patient and insurance preference. Use up to four times daily FOR ICD-9 250.00, 250.01 06/11/16   [name], MD  Cholecalciferol (VITAMIN D3) 400 units CAPS Take 500 Units by mouth.    [provider]  Cinnamon 500 MG TABS Take 1 tablet by mouth.    [provider]  empagliflozin (JARDIANCE) 10 MG TABS tablet Take 10 mg by mouth daily.    [provider]  Forskolin POWD by Does not apply route.    [provider]  Garcinia Cambogia-Chromium 500-200 MG-MCG TABS Take 1 tablet by mouth.    [provider]  glipiZIDE (GLUCOTROL) 5 MG tablet Take 1 tablet (5 mg total) by mouth 2 (two) times daily with a meal. 06/13/19 09/11/19  [name], MD  glucose blood test strip Use as instructed 09/15/17   Wallis Bamberg, PA-C  lovastatin (MEVACOR) 40 MG tablet TAKE 1 TABLET BY MOUTH EVERYDAY AT BEDTIME 10/05/19   [name], MD  metFORMIN (GLUCOPHAGE) 500 MG tablet TAKE 2 TABLETS (1,000 MG TOTAL) BY MOUTH 2 (TWO) TIMES DAILY WITH A MEAL.  10/05/19   [name], MD  nabumetone (RELAFEN) 500 MG tablet Take 1 tablet (500 mg total) by mouth 2 (two) times daily as needed. 01/01/18   [name], [name], MD  naproxen (NAPROSYN) 500 MG tablet Take 1 tablet (500 mg total) by mouth 2 (two) times daily with a meal. Take only as needed. 10/13/18   [name], MD  Omega-3 Fatty Acids (FISH OIL) 1000 MG CAPS Take 1 capsule by mouth.    [provider]  quinapril (ACCUPRIL) 20 MG tablet TAKE 1 TABLET BY MOUTH EVERY DAY 10/05/19   [name], MD  tiZANidine (ZANAFLEX) 2 MG tablet Take 1-2 tablets (2-4 mg total) by mouth every 6 (six) hours as needed for muscle spasms. 01/01/18   [name], [name], MD    Not on File  Patient Active Problem List   Diagnosis Date Noted  . Dyslipidemia associated with type 2 diabetes mellitus (HCC) 03/29/2015  . Hyperlipidemia 03/29/2015  . Hypertension associated with diabetes (HCC) 03/29/2015    Past Medical History:  Diagnosis Date  . Diabetes mellitus without complication [facility])     Past Surgical History:  Procedure Laterality Date  . CATARACT EXTRACTION Bilateral   . SHOULDER ARTHROSCOPY Right 2011  . TONSILLECTOMY      Social History   Socioeconomic History  . Marital status: Married    Spouse name: Not  on file  . Number of children: 4  . Years of education: Not on file  . Highest education level: Not on file  Occupational History  . Occupation: Scientist, product/process development    Comment: Production designer, theatre/television/film  Tobacco Use  . Smoking status: Former Smoker    Quit date: 04/01/1991    Years since quitting: 28.5  . Smokeless tobacco: Never Used  Substance and Sexual Activity  . Alcohol use: No    Alcohol/week: 0.0 standard drinks  . Drug use: No  . Sexual activity: Not on file  Other Topics Concern  . Not on file  Social History Narrative   Lives with his wife, who is on disability.   4 adult children live independently, scattered across the country.   Social Determinants  of Health   Financial Resource Strain:   . Difficulty of Paying Living Expenses:   Food Insecurity:   . Worried About Charity fundraiser in the Last Year:   . Arboriculturist in the Last Year:   Transportation Needs:   . Film/video editor (Medical):   [name] Lack of Transportation (Non-Medical):   Physical Activity:   . Days of Exercise per Week:   . Minutes of Exercise per Session:   Stress:   . Feeling of Stress :   Social Connections:   . Frequency of Communication with Friends and Family:   . Frequency of Social Gatherings with Friends and Family:   . Attends Religious Services:   . Active Member of Clubs or Organizations:   . Attends Archivist Meetings:   [name] Marital Status:   Intimate Partner Violence:   . Fear of Current or Ex-Partner:   . Emotionally Abused:   [name] Physically Abused:   . Sexually Abused:     Family History  Problem Relation Age of Onset  . Heart disease Father 77       CHF  . Diabetes Sister   . Arthritis Mother        OA, DDD  . Osteoporosis Mother   . Diabetes Maternal Grandmother      Review of Systems  Constitutional: Negative.  Negative for chills and fever.  HENT: Negative for congestion, nosebleeds and sore throat.   Eyes: Positive for discharge and redness. Negative for blurred vision, double vision, photophobia and pain.  Respiratory: Negative for cough and shortness of breath.   Gastrointestinal: Negative for abdominal pain, diarrhea, nausea and vomiting.  Skin: Negative.  Negative for rash.  Neurological: Negative for dizziness and headaches.  All other systems reviewed and are negative.   Today's Vitals   10/06/19 1533  BP: 116/76  Pulse: 73  Resp: 16  Temp: 97.9 F (36.6 C)  TempSrc: Temporal  SpO2: 95%  Weight: 246 lb (111.6 kg)  Height: _0  (1.93 m)   Body mass index is 29.94 kg/m.  Physical Exam Vitals reviewed.  Constitutional:      Appearance: Normal appearance.  HENT:     Head: Normocephalic.    Eyes:     General:        Left eye: Discharge present.    Extraocular Movements: Extraocular movements intact.     Conjunctiva/sclera:     Left eye: Left conjunctiva is injected.     Pupils: Pupils are equal, round, and reactive to light.     Comments: Erythematous eyelids with mild swelling on left side  Cardiovascular:     Rate and Rhythm: Normal rate.  Pulmonary:  Effort: Pulmonary effort is normal.  Musculoskeletal:        General: Normal range of motion.     Cervical back: Normal range of motion.  Skin:    General: Skin is warm and dry.  Neurological:     General: No focal deficit present.     Mental Status: He is alert and oriented to person, place, and time.  Psychiatric:        Mood and Affect: Mood normal.        Behavior: Behavior normal.      ASSESSMENT & PLAN: Christian Harrington was seen today for eye problem.  Diagnoses and all orders for this visit:  Acute conjunctivitis of left eye, unspecified acute conjunctivitis type -     tobramycin (TOBREX) 0.3 % ophthalmic solution; Place 2 drops into the left eye 3 (three) times daily for 7 days.  Blepharitis of both upper and lower eyelid of left eye, unspecified type -     cefadroxil (DURICEF) 500 MG capsule; Take 1 capsule (500 mg total) by mouth 2 (two) times daily for 7 days.  Hypertension associated with diabetes ([place])  Dyslipidemia associated with type 2 diabetes mellitus [facility])    Patient Instructions       If you have lab work done today you will be contacted with your lab results within the next 2 weeks.  If you have not heard from [place] then please contact us. The fastest way to get your results is to register for My Chart.   IF you received an x-ray today, you will receive an invoice from [facility]. Please contact [facility] at [phone] with questions or concerns regarding your invoice.   IF you received labwork today, you will receive an invoice from [facility]. Please contact [facility]  at [phone] with questions or concerns regarding your invoice.   Our billing staff will not be able to assist you with questions regarding bills from these companies.  You will be contacted with the lab results as soon as they are available. The fastest way to get your results is to activate your My Chart account. Instructions are located on the last page of this paperwork. If you have not heard from [place] regarding the results in 2 weeks, please contact this office.     Blepharitis Blepharitis is swelling of the eyelids. Symptoms may include:  Reddish, scaly skin around the scalp and eyebrows.  Burning or itching of the eyelids.  Fluid coming from the eye at night. This causes the eyelashes to stick together in the morning.  Eyelashes that fall out.  Being sensitive to light. Follow these instructions at home: Pay attention to any changes in how you look or feel. Tell your health care provider about any changes. Follow these instructions to help with your condition: Keeping clean   Wash your hands often.  Wash your eyelids with warm water, or wash them with warm water that is mixed with little bit of baby shampoo. Do this 2 or more times per day.  Wash your face and eyebrows at least once a day.  Use a clean towel each time you dry your eyelids. Do not use the towel to clean or dry other areas of your body. Do not share your towel with anyone. General instructions  Avoid wearing makeup until you get better. Do not share makeup with anyone.  Avoid rubbing your eyes.  Put a warm compress on your eyes 2 times per day for 10 minutes at a time, or  as told by your doctor.  If you were given antibiotics in the form of creams or eye drops, use the medicine as told by your doctor. Do not stop using the medicine even if you feel better.  Keep all follow-up visits as told by your doctor. This is important. Contact a doctor if:  Your eyelids feel hot.  You have blisters on your  eyelids.  You have a rash on your eyelids.  The swelling does not go away in 2-4 days.  The swelling gets worse. Get help right away if:  You have pain that gets worse.  You have pain that spreads to other parts of your face.  You have redness that gets worse.  You have redness that spreads to other parts of your face.  Your vision changes.  You have pain when you look at lights or things that move.  You have a fever. Summary  Blepharitis is swelling of the eyelids.  Pay attention to any changes in how your eyes look or feel. Tell your doctor about any changes.  Follow home care instructions as told by your doctor. Wash your hands often. Avoid wearing makeup. Do not rub your eyes.  Use warm compresses, creams, or eye drops as told by your doctor.  Let your doctor know if you have changes in vision, blisters or rash on eyelids, pain that spreads to your face, or warmth on your eyelids. This information is not intended to replace advice given to you by your health care provider. Make sure you discuss any questions you have with your health care provider. Document Revised: 09/14/2017 Document Reviewed: 09/14/2017 Elsevier Patient Education  [place].  Bacterial Conjunctivitis, Adult Bacterial conjunctivitis is an infection of your conjunctiva. This is the clear membrane that covers the white part of your eye and the inner part of your eyelid. This infection can make your eye:  Red or pink.  Itchy. This condition spreads easily from person to person (is contagious) and from one eye to the other eye. What are the causes?  This condition is caused by germs (bacteria). You may get the infection if you come into close contact with: ? A person who has the infection. ? Items that have germs on them (are contaminated), such as face towels, contact lens solution, or eye makeup. What increases the risk? You are more likely to get this condition if you:  Have contact  with people who have the infection.  Wear contact lenses.  Have a sinus infection.  Have had a recent eye injury or surgery.  Have a weak body defense system (immune system).  Have dry eyes. What are the signs or symptoms?   Thick, yellowish discharge from the eye.  Tearing or watery eyes.  Itchy eyes.  Burning feeling in your eyes.  Eye redness.  Swollen eyelids.  Blurred vision. How is this treated?   Antibiotic eye drops or ointment.  Antibiotic medicine taken by mouth. This is used for infections that do not get better with drops or ointment or that last more than 10 days.  Cool, wet cloths placed on the eyes.  Artificial tears used 2-6 times a day. Follow these instructions at home: Medicines  Take or apply your antibiotic medicine as told by your doctor. Do not stop taking or applying the antibiotic even if you start to feel better.  Take or apply over-the-counter and prescription medicines only as told by your doctor.  Do not touch your eyelid with the  eye-drop bottle or the ointment tube. Managing discomfort  Wipe any fluid from your eye with a warm, wet washcloth or a cotton ball.  Place a clean, cool, wet cloth on your eye. Do this for 10-20 minutes, 3-4 times per day. General instructions  Do not wear contacts until the infection is gone. Wear glasses until your doctor says it is okay to wear contacts again.  Do not wear eye makeup until the infection is gone. Throw away old eye makeup.  Change or wash your pillowcase every day.  Do not share towels or washcloths.  Wash your hands often with soap and water. Use paper towels to dry your hands.  Do not touch or rub your eyes.  Do not drive or use heavy machinery if your vision is blurred. Contact a doctor if:  You have a fever.  You do not get better after 10 days. Get help right away if:  You have a fever and your symptoms get worse all of a sudden.  You have very bad pain when you  move your eye.  Your face: ? Hurts. ? Is red. ? Is swollen.  You have sudden loss of vision. Summary  Bacterial conjunctivitis is an infection of your conjunctiva.  This infection spreads easily from person to person.  Wash your hands often with soap and water. Use paper towels to dry your hands.  Take or apply your antibiotic medicine as told by your doctor.  Contact a doctor if you have a fever or you do not get better after 10 days. This information is not intended to replace advice given to you by your health care provider. Make sure you discuss any questions you have with your health care provider. Document Revised: 07/06/2018 Document Reviewed: 10/21/2017 Elsevier Patient Education  2020 [employer]      Agustina Caroli, MD [facility]

## 2019-11-01 ENCOUNTER — Encounter: Payer: Self-pay | Admitting: Emergency Medicine

## 2019-11-01 ENCOUNTER — Other Ambulatory Visit: Payer: Self-pay

## 2019-11-01 ENCOUNTER — Ambulatory Visit (INDEPENDENT_AMBULATORY_CARE_PROVIDER_SITE_OTHER): Payer: Medicare HMO | Admitting: Emergency Medicine

## 2019-11-01 VITALS — BP 132/82 | HR 83 | Temp 98.5°F | Resp 15 | Ht 76.0 in | Wt 242.0 lb

## 2019-11-01 DIAGNOSIS — E1169 Type 2 diabetes mellitus with other specified complication: Secondary | ICD-10-CM

## 2019-11-01 DIAGNOSIS — E785 Hyperlipidemia, unspecified: Secondary | ICD-10-CM

## 2019-11-01 DIAGNOSIS — E1165 Type 2 diabetes mellitus with hyperglycemia: Secondary | ICD-10-CM

## 2019-11-01 DIAGNOSIS — N481 Balanitis: Secondary | ICD-10-CM | POA: Diagnosis not present

## 2019-11-01 DIAGNOSIS — R8290 Unspecified abnormal findings in urine: Secondary | ICD-10-CM

## 2019-11-01 DIAGNOSIS — I10 Essential (primary) hypertension: Secondary | ICD-10-CM

## 2019-11-01 DIAGNOSIS — E1159 Type 2 diabetes mellitus with other circulatory complications: Secondary | ICD-10-CM | POA: Diagnosis not present

## 2019-11-01 DIAGNOSIS — I152 Hypertension secondary to endocrine disorders: Secondary | ICD-10-CM

## 2019-11-01 LAB — POCT GLYCOSYLATED HEMOGLOBIN (HGB A1C): Hemoglobin A1C: 7.9 % — AB (ref 4.0–5.6)

## 2019-11-01 LAB — POCT URINALYSIS DIP (MANUAL ENTRY)
Bilirubin, UA: NEGATIVE
Blood, UA: NEGATIVE
Glucose, UA: >=1000 mg/dL — AB
Leukocytes, UA: NEGATIVE
Nitrite, UA: NEGATIVE
Protein Ur, POC: 30 mg/dL — AB
Spec Grav, UA: 1.02 (ref 1.010–1.025)
Urobilinogen, UA: 0.2 E.U./dL
pH, UA: 5 (ref 5.0–8.0)

## 2019-11-01 LAB — GLUCOSE, POCT (MANUAL RESULT ENTRY): POC Glucose: 158 mg/dl — AB (ref 70–99)

## 2019-11-01 MED ORDER — CLOTRIMAZOLE-BETAMETHASONE 1-0.05 % EX CREA: 1.0000 "application " | TOPICAL_CREAM | Freq: Two times a day (BID) | CUTANEOUS | 1 refills | Status: AC

## 2019-11-01 MED ORDER — CANAGLIFLOZIN 100 MG PO TABS: 100.0000 mg | ORAL_TABLET | Freq: Every day | ORAL | 1 refills | Status: AC

## 2019-11-01 NOTE — Progress Notes (Signed)
Christian Harrington 64 y.o.   Chief Complaint  Patient presents with  . Diabetes    pt has been doing well, pt has been working on his diet to keep sugars low but has not been exercising regularly   . Rash    pt has a genital rash that started 2 weeks ago apx pt notes urine has had very potenet smelling urine pt believs this is from the jardiance     HISTORY OF PRESENT ILLNESS: This is a 64 y.o. male with history of diabetes and hypertension here for follow-up. 1.  Diabetes: On Metformin 1000 mg twice a day, glipizide 5 mg twice a day, and Jardiance 10 mg once a day.  However complaining of a genital rash that started 2 weeks ago believed to be secondary to [place].  This never happened in the past when he was taking Invokana.  Trulicity too expensive not fully covered by medical insurance.  Blood glucose readings at home between 113 and 130. Lab Results  Component Value Date   HGBA1C 7.9 (A) 11/01/2019    2.  Hypertension: Doing well on Accupril 20 mg daily. BP Readings from Last 3 Encounters:  11/01/19 132/82  10/06/19 116/76  07/19/19 107/69    3.  Dyslipidemia: Doing well, on lovastatin 40 mg daily.  Last 2 lipid profile was within normal limits. No complaints or medical concerns today. Lab Results  Component Value Date   CHOL 144 07/19/2019   HDL 37 (L) 07/19/2019   LDLCALC 87 07/19/2019   TRIG 107 07/19/2019   CHOLHDL 3.9 07/19/2019    Wt Readings from Last 3 Encounters:  11/01/19 242 lb (109.8 kg)  10/06/19 246 lb (111.6 kg)  07/19/19 242 lb (109.8 kg)     HPI   Prior to Admission medications   Medication Sig Start Date End Date Taking? Authorizing Provider  aspirin 325 MG tablet Take 325 mg by mouth every other day.     [provider]  blood glucose meter kit and supplies KIT Dispense per patient and insurance preference. Use up to four times daily FOR ICD-9 250.00, 250.01 06/11/16   Alveda Reasons, MD  Cholecalciferol (VITAMIN D3) 400 units CAPS  Take 500 Units by mouth.    [provider]  Cinnamon 500 MG TABS Take 1 tablet by mouth.    [provider]  empagliflozin (JARDIANCE) 10 MG TABS tablet Take 10 mg by mouth daily.    [provider]  Forskolin POWD by Does not apply route.    [provider]  Garcinia Cambogia-Chromium 500-200 MG-MCG TABS Take 1 tablet by mouth.    [provider]  glipiZIDE (GLUCOTROL) 5 MG tablet Take 1 tablet (5 mg total) by mouth 2 (two) times daily with a meal. 06/13/19 09/11/19  Horald Pollen, MD  glucose blood test strip Use as instructed 09/15/17   Jaynee Eagles, PA-C  lovastatin (MEVACOR) 40 MG tablet TAKE 1 TABLET BY MOUTH EVERYDAY AT BEDTIME 10/05/19   Horald Pollen, MD  metFORMIN (GLUCOPHAGE) 500 MG tablet TAKE 2 TABLETS (1,000 MG TOTAL) BY MOUTH 2 (TWO) TIMES DAILY WITH A MEAL. 10/05/19   Horald Pollen, MD  nabumetone (RELAFEN) 500 MG tablet Take 1 tablet (500 mg total) by mouth 2 (two) times daily as needed. Patient not taking: Reported on 10/06/2019 01/01/18   [name], Legrand Como, MD  naproxen (NAPROSYN) 500 MG tablet Take 1 tablet (500 mg total) by mouth 2 (two) times daily with a meal.  Take only as needed. 10/13/18   Horald Pollen, MD  Omega-3 Fatty Acids (FISH OIL) 1000 MG CAPS Take 1 capsule by mouth.    [provider]  quinapril (ACCUPRIL) 20 MG tablet TAKE 1 TABLET BY MOUTH EVERY DAY 10/05/19   Horald Pollen, MD  tiZANidine (ZANAFLEX) 2 MG tablet Take 1-2 tablets (2-4 mg total) by mouth every 6 (six) hours as needed for muscle spasms. Patient not taking: Reported on 10/06/2019 01/01/18   [name], Legrand Como, MD    No Known Allergies  Patient Active Problem List   Diagnosis Date Noted  . Dyslipidemia associated with type 2 diabetes mellitus ([place]) 03/29/2015  . Hyperlipidemia 03/29/2015  . Hypertension associated with diabetes ([place]) 03/29/2015    Past Medical History:  Diagnosis Date  . Diabetes mellitus without  complication [facility])     Past Surgical History:  Procedure Laterality Date  . CATARACT EXTRACTION Bilateral   . SHOULDER ARTHROSCOPY Right 2011  . TONSILLECTOMY      Social History   Socioeconomic History  . Marital status: Married    Spouse name: Not on file  . Number of children: 4  . Years of education: Not on file  . Highest education level: Not on file  Occupational History  . Occupation: Scientist, product/process development    Comment: Production designer, theatre/television/film  Tobacco Use  . Smoking status: Former Smoker    Quit date: 04/01/1991    Years since quitting: 28.6  . Smokeless tobacco: Never Used  Substance and Sexual Activity  . Alcohol use: No    Alcohol/week: 0.0 standard drinks  . Drug use: No  . Sexual activity: Not on file  Other Topics Concern  . Not on file  Social History Narrative   Lives with his wife, who is on disability.   4 adult children live independently, scattered across the country.   Social Determinants of Health   Financial Resource Strain:   . Difficulty of Paying Living Expenses:   Food Insecurity:   . Worried About Charity fundraiser in the Last Year:   . Arboriculturist in the Last Year:   Transportation Needs:   . Film/video editor (Medical):   [name] Lack of Transportation (Non-Medical):   Physical Activity:   . Days of Exercise per Week:   . Minutes of Exercise per Session:   Stress:   . Feeling of Stress :   Social Connections:   . Frequency of Communication with Friends and Family:   . Frequency of Social Gatherings with Friends and Family:   . Attends Religious Services:   . Active Member of Clubs or Organizations:   . Attends Archivist Meetings:   [name] Marital Status:   Intimate Partner Violence:   . Fear of Current or Ex-Partner:   . Emotionally Abused:   [name] Physically Abused:   . Sexually Abused:     Family History  Problem Relation Age of Onset  . Heart disease Father 52       CHF  . Diabetes Sister   . Arthritis Mother        OA,  DDD  . Osteoporosis Mother   . Diabetes Maternal Grandmother      Review of Systems  Constitutional: Negative.  Negative for chills and fever.  HENT: Negative.  Negative for congestion and sore throat.   Respiratory: Negative.  Negative for cough and shortness of breath.   Cardiovascular: Negative.  Negative for chest pain and palpitations.  Gastrointestinal: Negative.  Negative for abdominal pain, diarrhea, nausea and vomiting.  Genitourinary: Negative.  Negative for dysuria, frequency and hematuria.  Skin: Positive for rash.  Neurological: Negative.  Negative for dizziness and headaches.  All other systems reviewed and are negative.    Today's Vitals   11/01/19 1431  BP: 132/82  Pulse: 83  Resp: 15  Temp: 98.5 F (36.9 C)  TempSrc: Temporal  SpO2: 96%  Weight: 242 lb (109.8 kg)  Height: _0  (1.93 m)   Body mass index is 29.46 kg/m.   Physical Exam Vitals reviewed.  Constitutional:      Appearance: Normal appearance.  HENT:     Head: Normocephalic.  Eyes:     Extraocular Movements: Extraocular movements intact.     Pupils: Pupils are equal, round, and reactive to light.  Cardiovascular:     Rate and Rhythm: Normal rate and regular rhythm.     Pulses: Normal pulses.     Heart sounds: Normal heart sounds.  Pulmonary:     Effort: Pulmonary effort is normal.     Breath sounds: Normal breath sounds.  Abdominal:     Hernia: There is no hernia in the left inguinal area or right inguinal area.  Genitourinary:    Penis: Circumcised.      Testes: Normal.     Epididymis:     Right: Normal.     Left: Normal.     Comments: Positive balanitis Musculoskeletal:        General: Normal range of motion.     Cervical back: Normal range of motion and neck supple.  Lymphadenopathy:     Lower Body: No right inguinal adenopathy. No left inguinal adenopathy.  Skin:    General: Skin is warm and dry.     Capillary Refill: Capillary refill takes less than 2 seconds.    Neurological:     General: No focal deficit present.     Mental Status: He is alert and oriented to person, place, and time.  Psychiatric:        Mood and Affect: Mood normal.        Behavior: Behavior normal.     Results for orders placed or performed in visit on 11/01/19 (from the past 24 hour(s))  POCT glucose (manual entry)     Status: Abnormal   Collection Time: 11/01/19  2:43 PM  Result Value Ref Range   POC Glucose 158 (A) 70 - 99 mg/dl  POCT glycosylated hemoglobin (Hb A1C)     Status: Abnormal   Collection Time: 11/01/19  2:49 PM  Result Value Ref Range   Hemoglobin A1C 7.9 (A) 4.0 - 5.6 %   HbA1c POC (<> result, manual entry)     HbA1c, POC (prediabetic range)     HbA1c, POC (controlled diabetic range)      ASSESSMENT & PLAN: Hypertension associated with diabetes ([place]) Well-controlled hypertension.  Continue present medication.  No changes.  Normal renal function. Uncontrolled diabetes with hemoglobin A1c of 7.9.  Continue Metformin 1000 mg twice a day and glipizide 5 mg twice a day.  Patient has balanitis secondary to Jardiance that will be treated with Lotrisone.  Did not have this problem before when he was taking Invokana.  Will start Invokana 100 mg daily.  Diet and nutrition discussed.  Follow-up in 3 to 4 months.   Carey was seen today for diabetes and rash.  Diagnoses and all orders for this visit:  Hypertension associated with diabetes ([place]) -  POCT glucose (manual entry) -     POCT glycosylated hemoglobin (Hb A1C) -     Comprehensive metabolic panel  Dyslipidemia associated with type 2 diabetes mellitus (H[facility]) -     Lipid panel  Foul smelling urine -     POCT urinalysis dipstick  Balanitis -     clotrimazole-betamethasone (LOTRISONE) cream; Apply 1 application topically 2 (two) times daily.  Uncontrolled type 2 diabetes mellitus with hyperglycemia (HCC) -     canagliflozin (INVOKANA) 100 MG TABS tablet; Take 1 tablet (100 mg total) by mouth  daily before breakfast.    Patient Instructions       If you have lab work done today you will be contacted with your lab results within the next 2 weeks.  If you have not heard from [place] then please contact us. The fastest way to get your results is to register for My Chart.   IF you received an x-ray today, you will receive an invoice from [facility]. Please contact [facility] at [phone] with questions or concerns regarding your invoice.   IF you received labwork today, you will receive an invoice from [place]. Please contact [facility] at [phone] with questions or concerns regarding your invoice.   Our billing staff will not be able to assist you with questions regarding bills from these companies.  You will be contacted with the lab results as soon as they are available. The fastest way to get your results is to activate your My Chart account. Instructions are located on the last page of this paperwork. If you have not heard from [place] regarding the results in 2 weeks, please contact this office.     Balanitis  Balanitis is swelling and irritation (inflammation) of the head of the penis (glans penis). The condition may also cause inflammation of the skin around the glans penis (foreskin) in men who have not been circumcised. It may develop because of an infection or another medical condition. Balanitis occurs most often among men who have not had their foreskin removed (uncircumcised men). Balanitis sometimes causes scarring of the penis or foreskin, which can require surgery. Untreated balanitis can increase the risk of penile cancer. What are the causes? Common causes of this condition include:  Poor personal hygiene, especially in uncircumcised men. Not cleaning the glans penis and foreskin well can result in buildup of bacteria, viruses, and yeast, which can lead to infection and inflammation.  Irritation and lack of air flow due to fluid (smegma) that  can build up on the glans penis. Other causes include:  Chemical irritation from products such as soaps or shower gels (especially those that have fragrance), condoms, personal lubricants, petroleum jelly, spermicides, or fabric softeners.  Skin conditions, such as eczema, dermatitis, and psoriasis.  Allergies to medicines, such as tetracycline and sulfa drugs.  Certain medical conditions, including liver cirrhosis, congestive heart failure, diabetes, and kidney disease.  Infections, such as candidiasis, HPV (human papillomavirus), herpes simplex, gonorrhea, and syphilis.  Severe obesity. What increases the risk? The following factors may make you more likely to develop this condition:  Having diabetes. This is the most common risk factor.  Having a tight foreskin that is difficult to pull back (retract) past the glans.  Having sexual intercourse without using a condom. What are the signs or symptoms? Symptoms of this condition include:  Discharge from under the foreskin.  A bad smell.  Pain or difficulty retracting the foreskin.  Tenderness, redness, and swelling of the glans.  A rash or sores on the glans or foreskin.  Itchiness.  Inability to get an erection due to pain.  Difficulty urinating.  Scarring of the penis or foreskin, in some cases. How is this diagnosed? This condition may be diagnosed based on:  A physical exam.  Testing a swab of discharge to check for bacterial or fungal infection.  Blood tests: ? To check for viruses that can cause balanitis. ? To check your blood sugar (glucose) level. High blood glucose could be a sign of diabetes, which can cause balanitis. How is this treated? Treatment for balanitis depends on the cause. Treatment may include:  Improving personal hygiene. Your health care provider may recommend sitting in a bath of warm water that is deep enough to cover your hips and buttocks (sitz bath).  Medicines such as: ? Creams  or ointments to reduce swelling (steroids) or to treat an infection. ? Antibiotic medicine. ? Antifungal medicine.  Surgery to remove or cut the foreskin (circumcision). This may be done if you have scarring on the foreskin that makes it difficult to retract.  Controlling other medical problems that may be causing your condition or making it worse. Follow these instructions at home:  Do not have sex until the condition clears up, or until your health care provider approves.  Keep your penis clean and dry. Take sitz baths as recommended by your health care provider.  Avoid products that irritate your skin or make symptoms worse, such as soaps and shower gels that have fragrance.  Take over-the-counter and prescription medicines only as told by your health care provider. ? If you were prescribed an antibiotic medicine or a cream or ointment, use it as told by your health care provider. Do not stop using your medicine, cream, or ointment even if you start to feel better. ? Do not drive or use heavy machinery while taking prescription pain medicine. Contact a health care provider if:  Your symptoms get worse or do not improve with home care.  You develop chills or a fever.  You have trouble urinating.  You cannot retract your foreskin. Get help right away if:  You develop severe pain.  You are unable to urinate. Summary  Balanitis is inflammation of the head of the penis (glans penis) caused by irritation or infection.  Balanitis causes pain, redness, and swelling of the glans penis.  This condition is most common among uncircumcised men who do not keep their glans penis clean and in men who have diabetes.  Treatment may include creams or ointments.  Good hygiene is important for prevention. This includes pulling back the foreskin when washing your penis. This information is not intended to replace advice given to you by your health care provider. Make sure you discuss any  questions you have with your health care provider. Document Revised: 02/27/2017 Document Reviewed: 02/04/2016 Elsevier Patient Education  [place].  Diabetes Mellitus and Nutrition, Adult When you have diabetes (diabetes mellitus), it is very important to have healthy eating habits because your blood sugar (glucose) levels are greatly affected by what you eat and drink. Eating healthy foods in the appropriate amounts, at about the same times every day, can help you:  Control your blood glucose.  Lower your risk of heart disease.  Improve your blood pressure.  Reach or maintain a healthy weight. Every person with diabetes is different, and each person has different needs for a meal plan. Your health care provider may recommend that you work with  a diet and nutrition specialist (dietitian) to make a meal plan that is best for you. Your meal plan may vary depending on factors such as:  The calories you need.  The medicines you take.  Your weight.  Your blood glucose, blood pressure, and cholesterol levels.  Your activity level.  Other health conditions you have, such as heart or kidney disease. How do carbohydrates affect me? Carbohydrates, also called carbs, affect your blood glucose level more than any other type of food. Eating carbs naturally raises the amount of glucose in your blood. Carb counting is a method for keeping track of how many carbs you eat. Counting carbs is important to keep your blood glucose at a healthy level, especially if you use insulin or take certain oral diabetes medicines. It is important to know how many carbs you can safely have in each meal. This is different for every person. Your dietitian can help you calculate how many carbs you should have at each meal and for each snack. Foods that contain carbs include:  Bread, cereal, rice, pasta, and crackers.  Potatoes and corn.  Peas, beans, and lentils.  Milk and yogurt.  Fruit and  juice.  Desserts, such as cakes, cookies, ice cream, and candy. How does alcohol affect me? Alcohol can cause a sudden decrease in blood glucose (hypoglycemia), especially if you use insulin or take certain oral diabetes medicines. Hypoglycemia can be a life-threatening condition. Symptoms of hypoglycemia (sleepiness, dizziness, and confusion) are similar to symptoms of having too much alcohol. If your health care provider says that alcohol is safe for you, follow these guidelines:  Limit alcohol intake to no more than 1 drink per day for nonpregnant women and 2 drinks per day for men. One drink equals 12 oz of beer, 5 oz of wine, or 1 oz of hard liquor.  Do not drink on an empty stomach.  Keep yourself hydrated with water, diet soda, or unsweetened iced tea.  Keep in mind that regular soda, juice, and other mixers may contain a lot of sugar and must be counted as carbs. What are tips for following this plan?  Reading food labels  Start by checking the serving size on the "Nutrition Facts" label of packaged foods and drinks. The amount of calories, carbs, fats, and other nutrients listed on the label is based on one serving of the item. Many items contain more than one serving per package.  Check the total grams (g) of carbs in one serving. You can calculate the number of servings of carbs in one serving by dividing the total carbs by 15. For example, if a food has 30 g of total carbs, it would be equal to 2 servings of carbs.  Check the number of grams (g) of saturated and trans fats in one serving. Choose foods that have low or no amount of these fats.  Check the number of milligrams (mg) of salt (sodium) in one serving. Most people should limit total sodium intake to less than 2,300 mg per day.  Always check the nutrition information of foods labeled as "low-fat" or "nonfat". These foods may be higher in added sugar or refined carbs and should be avoided.  Talk to your dietitian to  identify your daily goals for nutrients listed on the label. Shopping  Avoid buying canned, premade, or processed foods. These foods tend to be high in fat, sodium, and added sugar.  Shop around the outside edge of the grocery store. This includes fresh fruits  and vegetables, bulk grains, fresh meats, and fresh dairy. Cooking  Use low-heat cooking methods, such as baking, instead of high-heat cooking methods like deep frying.  Cook using healthy oils, such as olive, canola, or sunflower oil.  Avoid cooking with butter, cream, or high-fat meats. Meal planning  Eat meals and snacks regularly, preferably at the same times every day. Avoid going long periods of time without eating.  Eat foods high in fiber, such as fresh fruits, vegetables, beans, and whole grains. Talk to your dietitian about how many servings of carbs you can eat at each meal.  Eat 4-6 ounces (oz) of lean protein each day, such as lean meat, chicken, fish, eggs, or tofu. One oz of lean protein is equal to: ? 1 oz of meat, chicken, or fish. ? 1 egg. ?  cup of tofu.  Eat some foods each day that contain healthy fats, such as avocado, nuts, seeds, and fish. Lifestyle  Check your blood glucose regularly.  Exercise regularly as told by your health care provider. This may include: ? 150 minutes of moderate-intensity or vigorous-intensity exercise each week. This could be brisk walking, biking, or water aerobics. ? Stretching and doing strength exercises, such as yoga or weightlifting, at least 2 times a week.  Take medicines as told by your health care provider.  Do not use any products that contain nicotine or tobacco, such as cigarettes and e-cigarettes. If you need help quitting, ask your health care provider.  Work with a Social worker or diabetes educator to identify strategies to manage stress and any emotional and social challenges. Questions to ask a health care provider  Do I need to meet with a diabetes  educator?  Do I need to meet with a dietitian?  What number can I call if I have questions?  When are the best times to check my blood glucose? Where to find more information:  American Diabetes Association: diabetes.org  Academy of Nutrition and Dietetics: www.eatright.[employer] of Diabetes and Digestive and Kidney Diseases (NIH): [employer] Summary  A healthy meal plan will help you control your blood glucose and maintain a healthy lifestyle.  Working with a diet and nutrition specialist (dietitian) can help you make a meal plan that is best for you.  Keep in mind that carbohydrates (carbs) and alcohol have immediate effects on your blood glucose levels. It is important to count carbs and to use alcohol carefully. This information is not intended to replace advice given to you by your health care provider. Make sure you discuss any questions you have with your health care provider. Document Revised: 02/27/2017 Document Reviewed: 04/21/2016 Elsevier Patient Education  2020 Elsevier Inc.     Agustina Caroli, MD [facility]

## 2019-11-01 NOTE — Patient Instructions (Addendum)
If you have lab work done today you will be contacted with your lab results within the next 2 weeks.  If you have not heard from [place] then please contact us. The fastest way to get your results is to register for My Chart.   IF you received an x-ray today, you will receive an invoice from [facility]. Please contact [facility] at [phone] with questions or concerns regarding your invoice.   IF you received labwork today, you will receive an invoice from [facility]. Please contact [facility] at [phone] with questions or concerns regarding your invoice.   Our billing staff will not be able to assist you with questions regarding bills from these companies.  You will be contacted with the lab results as soon as they are available. The fastest way to get your results is to activate your My Chart account. Instructions are located on the last page of this paperwork. If you have not heard from [place] regarding the results in 2 weeks, please contact this office.     Balanitis  Balanitis is swelling and irritation (inflammation) of the head of the penis (glans penis). The condition may also cause inflammation of the skin around the glans penis (foreskin) in men who have not been circumcised. It may develop because of an infection or another medical condition. Balanitis occurs most often among men who have not had their foreskin removed (uncircumcised men). Balanitis sometimes causes scarring of the penis or foreskin, which can require surgery. Untreated balanitis can increase the risk of penile cancer. What are the causes? Common causes of this condition include:  Poor personal hygiene, especially in uncircumcised men. Not cleaning the glans penis and foreskin well can result in buildup of bacteria, viruses, and yeast, which can lead to infection and inflammation.  Irritation and lack of air flow due to fluid (smegma) that can build up on the glans penis. Other causes  include:  Chemical irritation from products such as soaps or shower gels (especially those that have fragrance), condoms, personal lubricants, petroleum jelly, spermicides, or fabric softeners.  Skin conditions, such as eczema, dermatitis, and psoriasis.  Allergies to medicines, such as tetracycline and sulfa drugs.  Certain medical conditions, including liver cirrhosis, congestive heart failure, diabetes, and kidney disease.  Infections, such as candidiasis, HPV (human papillomavirus), herpes simplex, gonorrhea, and syphilis.  Severe obesity. What increases the risk? The following factors may make you more likely to develop this condition:  Having diabetes. This is the most common risk factor.  Having a tight foreskin that is difficult to pull back (retract) past the glans.  Having sexual intercourse without using a condom. What are the signs or symptoms? Symptoms of this condition include:  Discharge from under the foreskin.  A bad smell.  Pain or difficulty retracting the foreskin.  Tenderness, redness, and swelling of the glans.  A rash or sores on the glans or foreskin.  Itchiness.  Inability to get an erection due to pain.  Difficulty urinating.  Scarring of the penis or foreskin, in some cases. How is this diagnosed? This condition may be diagnosed based on:  A physical exam.  Testing a swab of discharge to check for bacterial or fungal infection.  Blood tests: ? To check for viruses that can cause balanitis. ? To check your blood sugar (glucose) level. High blood glucose could be a sign of diabetes, which can cause balanitis. How is this treated? Treatment for balanitis depends on the cause. Treatment may include:  Improving personal hygiene. Your health care provider may recommend sitting in a bath of warm water that is deep enough to cover your hips and buttocks (sitz bath).  Medicines such as: ? Creams or ointments to reduce swelling (steroids) or  to treat an infection. ? Antibiotic medicine. ? Antifungal medicine.  Surgery to remove or cut the foreskin (circumcision). This may be done if you have scarring on the foreskin that makes it difficult to retract.  Controlling other medical problems that may be causing your condition or making it worse. Follow these instructions at home:  Do not have sex until the condition clears up, or until your health care provider approves.  Keep your penis clean and dry. Take sitz baths as recommended by your health care provider.  Avoid products that irritate your skin or make symptoms worse, such as soaps and shower gels that have fragrance.  Take over-the-counter and prescription medicines only as told by your health care provider. ? If you were prescribed an antibiotic medicine or a cream or ointment, use it as told by your health care provider. Do not stop using your medicine, cream, or ointment even if you start to feel better. ? Do not drive or use heavy machinery while taking prescription pain medicine. Contact a health care provider if:  Your symptoms get worse or do not improve with home care.  You develop chills or a fever.  You have trouble urinating.  You cannot retract your foreskin. Get help right away if:  You develop severe pain.  You are unable to urinate. Summary  Balanitis is inflammation of the head of the penis (glans penis) caused by irritation or infection.  Balanitis causes pain, redness, and swelling of the glans penis.  This condition is most common among uncircumcised men who do not keep their glans penis clean and in men who have diabetes.  Treatment may include creams or ointments.  Good hygiene is important for prevention. This includes pulling back the foreskin when washing your penis. This information is not intended to replace advice given to you by your health care provider. Make sure you discuss any questions you have with your health care  provider. Document Revised: 02/27/2017 Document Reviewed: 02/04/2016 Elsevier Patient Education  2020 [employer].  Diabetes Mellitus and Nutrition, Adult When you have diabetes (diabetes mellitus), it is very important to have healthy eating habits because your blood sugar (glucose) levels are greatly affected by what you eat and drink. Eating healthy foods in the appropriate amounts, at about the same times every day, can help you:  Control your blood glucose.  Lower your risk of heart disease.  Improve your blood pressure.  Reach or maintain a healthy weight. Every person with diabetes is different, and each person has different needs for a meal plan. Your health care provider may recommend that you work with a diet and nutrition specialist (dietitian) to make a meal plan that is best for you. Your meal plan may vary depending on factors such as:  The calories you need.  The medicines you take.  Your weight.  Your blood glucose, blood pressure, and cholesterol levels.  Your activity level.  Other health conditions you have, such as heart or kidney disease. How do carbohydrates affect me? Carbohydrates, also called carbs, affect your blood glucose level more than any other type of food. Eating carbs naturally raises the amount of glucose in your blood. Carb counting is a method for keeping track of how many carbs you  eat. Counting carbs is important to keep your blood glucose at a healthy level, especially if you use insulin or take certain oral diabetes medicines. It is important to know how many carbs you can safely have in each meal. This is different for every person. Your dietitian can help you calculate how many carbs you should have at each meal and for each snack. Foods that contain carbs include:  Bread, cereal, rice, pasta, and crackers.  Potatoes and corn.  Peas, beans, and lentils.  Milk and yogurt.  Fruit and juice.  Desserts, such as cakes, cookies, ice  cream, and candy. How does alcohol affect me? Alcohol can cause a sudden decrease in blood glucose (hypoglycemia), especially if you use insulin or take certain oral diabetes medicines. Hypoglycemia can be a life-threatening condition. Symptoms of hypoglycemia (sleepiness, dizziness, and confusion) are similar to symptoms of having too much alcohol. If your health care provider says that alcohol is safe for you, follow these guidelines:  Limit alcohol intake to no more than 1 drink per day for nonpregnant women and 2 drinks per day for men. One drink equals 12 oz of beer, 5 oz of wine, or 1 oz of hard liquor.  Do not drink on an empty stomach.  Keep yourself hydrated with water, diet soda, or unsweetened iced tea.  Keep in mind that regular soda, juice, and other mixers may contain a lot of sugar and must be counted as carbs. What are tips for following this plan?  Reading food labels  Start by checking the serving size on the "Nutrition Facts" label of packaged foods and drinks. The amount of calories, carbs, fats, and other nutrients listed on the label is based on one serving of the item. Many items contain more than one serving per package.  Check the total grams (g) of carbs in one serving. You can calculate the number of servings of carbs in one serving by dividing the total carbs by 15. For example, if a food has 30 g of total carbs, it would be equal to 2 servings of carbs.  Check the number of grams (g) of saturated and trans fats in one serving. Choose foods that have low or no amount of these fats.  Check the number of milligrams (mg) of salt (sodium) in one serving. Most people should limit total sodium intake to less than 2,300 mg per day.  Always check the nutrition information of foods labeled as "low-fat" or "nonfat". These foods may be higher in added sugar or refined carbs and should be avoided.  Talk to your dietitian to identify your daily goals for nutrients listed on  the label. Shopping  Avoid buying canned, premade, or processed foods. These foods tend to be high in fat, sodium, and added sugar.  Shop around the outside edge of the grocery store. This includes fresh fruits and vegetables, bulk grains, fresh meats, and fresh dairy. Cooking  Use low-heat cooking methods, such as baking, instead of high-heat cooking methods like deep frying.  Cook using healthy oils, such as olive, canola, or sunflower oil.  Avoid cooking with butter, cream, or high-fat meats. Meal planning  Eat meals and snacks regularly, preferably at the same times every day. Avoid going long periods of time without eating.  Eat foods high in fiber, such as fresh fruits, vegetables, beans, and whole grains. Talk to your dietitian about how many servings of carbs you can eat at each meal.  Eat 4-6 ounces (oz) of lean protein  each day, such as lean meat, chicken, fish, eggs, or tofu. One oz of lean protein is equal to: ? 1 oz of meat, chicken, or fish. ? 1 egg. ?  cup of tofu.  Eat some foods each day that contain healthy fats, such as avocado, nuts, seeds, and fish. Lifestyle  Check your blood glucose regularly.  Exercise regularly as told by your health care provider. This may include: ? 150 minutes of moderate-intensity or vigorous-intensity exercise each week. This could be brisk walking, biking, or water aerobics. ? Stretching and doing strength exercises, such as yoga or weightlifting, at least 2 times a week.  Take medicines as told by your health care provider.  Do not use any products that contain nicotine or tobacco, such as cigarettes and e-cigarettes. If you need help quitting, ask your health care provider.  Work with a Veterinary surgeon or diabetes educator to identify strategies to manage stress and any emotional and social challenges. Questions to ask a health care provider  Do I need to meet with a diabetes educator?  Do I need to meet with a dietitian?  What  number can I call if I have questions?  When are the best times to check my blood glucose? Where to find more information:  American Diabetes Association: diabetes.org  Academy of Nutrition and Dietetics: www.eatright.[employer] of Diabetes and Digestive and Kidney Diseases (NIH): CarFlippers.tn Summary  A healthy meal plan will help you control your blood glucose and maintain a healthy lifestyle.  Working with a diet and nutrition specialist (dietitian) can help you make a meal plan that is best for you.  Keep in mind that carbohydrates (carbs) and alcohol have immediate effects on your blood glucose levels. It is important to count carbs and to use alcohol carefully. This information is not intended to replace advice given to you by your health care provider. Make sure you discuss any questions you have with your health care provider. Document Revised: 02/27/2017 Document Reviewed: 04/21/2016 Elsevier Patient Education  2020 [employer].

## 2019-11-01 NOTE — Assessment & Plan Note (Signed)
Well-controlled hypertension.  Continue present medication.  No changes.  Normal renal function. Uncontrolled diabetes with hemoglobin A1c of 7.9.  Continue Metformin 1000 mg twice a day and glipizide 5 mg twice a day.  Patient has balanitis secondary to Jardiance that will be treated with Lotrisone.  Did not have this problem before when he was taking Invokana.  Will start Invokana 100 mg daily.  Diet and nutrition discussed.  Follow-up in 3 to 4 months.

## 2019-11-02 LAB — COMPREHENSIVE METABOLIC PANEL
ALT: 22 IU/L (ref 0–44)
AST: 15 IU/L (ref 0–40)
Albumin/Globulin Ratio: 1.7 (ref 1.2–2.2)
Albumin: 4.5 g/dL (ref 3.8–4.8)
Alkaline Phosphatase: 102 IU/L (ref 48–121)
BUN/Creatinine Ratio: 16 (ref 10–24)
BUN: 15 mg/dL (ref 8–27)
Bilirubin Total: 0.6 mg/dL (ref 0.0–1.2)
CO2: 23 mmol/L (ref 20–29)
Calcium: 10.1 mg/dL (ref 8.6–10.2)
Chloride: 98 mmol/L (ref 96–106)
Creatinine, Ser: 0.94 mg/dL (ref 0.76–1.27)
GFR calc Af Amer: 98 mL/min/{1.73_m2} (ref >59)
GFR calc non Af Amer: 85 mL/min/{1.73_m2} (ref >59)
Globulin, Total: 2.6 g/dL (ref 1.5–4.5)
Glucose: 154 mg/dL — ABNORMAL HIGH (ref 65–99)
Potassium: 4.4 mmol/L (ref 3.5–5.2)
Sodium: 137 mmol/L (ref 134–144)
Total Protein: 7.1 g/dL (ref 6.0–8.5)

## 2019-11-02 LAB — LIPID PANEL
Chol/HDL Ratio: 5 ratio (ref 0.0–5.0)
Cholesterol, Total: 160 mg/dL (ref 100–199)
HDL: 32 mg/dL — ABNORMAL LOW (ref >39)
LDL Chol Calc (NIH): 100 mg/dL — ABNORMAL HIGH (ref 0–99)
Triglycerides: 156 mg/dL — ABNORMAL HIGH (ref 0–149)
VLDL Cholesterol Cal: 28 mg/dL (ref 5–40)

## 2019-12-28 ENCOUNTER — Other Ambulatory Visit: Payer: Self-pay | Admitting: Emergency Medicine

## 2019-12-28 DIAGNOSIS — E119 Type 2 diabetes mellitus without complications: Secondary | ICD-10-CM

## 2019-12-28 NOTE — Telephone Encounter (Signed)
Requested Prescriptions  Pending Prescriptions Disp Refills  . lovastatin (MEVACOR) 40 MG tablet [Pharmacy Med Name: LOVASTATIN 40 MG TABLET] 90 tablet 0    Sig: TAKE 1 TABLET BY MOUTH EVERYDAY AT BEDTIME     Cardiovascular:  Antilipid - Statins Failed - 12/28/2019  1:16 AM      Failed - LDL in normal range and within 360 days    LDL Chol Calc (NIH)  Date Value Ref Range Status  11/01/2019 100 (H) 0 - 99 mg/dL Final         Failed - HDL in normal range and within 360 days    HDL  Date Value Ref Range Status  11/01/2019 32 (L) >39 mg/dL Final         Failed - Triglycerides in normal range and within 360 days    Triglycerides  Date Value Ref Range Status  11/01/2019 156 (H) 0 - 149 mg/dL Final         Passed - Total Cholesterol in normal range and within 360 days    Cholesterol, Total  Date Value Ref Range Status  11/01/2019 160 100 - 199 mg/dL Final         Passed - Patient is not pregnant      Passed - Valid encounter within last 12 months    Recent Outpatient Visits          1 month ago Hypertension associated with diabetes [facility])   Primary Care at [place], [place], [place]   2 months ago Acute conjunctivitis of left eye, unspecified acute conjunctivitis type   Primary Care at [facility], [place], MD   5 months ago Hypertension associated with diabetes [facility])   Primary Care at [place], [place], MD   8 months ago Hypertension associated with diabetes [facility])   Primary Care at [facility], [place], MD   1 year ago Type 2 diabetes mellitus without complication, without long-term current use of insulin [facility])   Primary Care at [facility], Ines Bloomer, MD      Future Appointments            In 2 months Sagardia, Ines Bloomer, MD Primary Care at [place], [facility]           . metFORMIN (GLUCOPHAGE) 500 MG tablet [Pharmacy Med Name: METFORMIN HCL 500 MG TABLET] 360 tablet 0    Sig: TAKE 2 TABLETS (1,000 MG TOTAL) BY MOUTH 2 (TWO) TIMES  DAILY WITH A MEAL.     Endocrinology:  Diabetes - Biguanides Passed - 12/28/2019  1:16 AM      Passed - Cr in normal range and within 360 days    Creat  Date Value Ref Range Status  07/21/2015 0.86 0.70 - 1.25 mg/dL Final   Creatinine, Ser  Date Value Ref Range Status  11/01/2019 0.94 0.76 - 1.27 mg/dL Final         Passed - HBA1C is between 0 and 7.9 and within 180 days    Hemoglobin A1C  Date Value Ref Range Status  11/01/2019 7.9 (A) 4.0 - 5.6 % Final   Hgb A1c MFr Bld  Date Value Ref Range Status  10/13/2018 7.7 (H) 4.8 - 5.6 % Final    Comment:             Prediabetes: 5.7 - 6.4          Diabetes: >6.4          Glycemic control for adults with diabetes: <  7.0          Passed - eGFR in normal range and within 360 days    GFR, Est African American  Date Value Ref Range Status  03/29/2015 >89 >=60 mL/min Final   GFR calc Af Amer  Date Value Ref Range Status  11/01/2019 98 >59 mL/min/1.73 Final    Comment:    **[facility] currently reports eGFR in compliance with the current**   recommendations of the [employer]. [facility] will   update reporting as new guidelines are published from the NKF-ASN   Task force.    GFR, Est Non African American  Date Value Ref Range Status  03/29/2015 >89 >=60 mL/min Final    Comment:      The estimated GFR is a calculation valid for adults (>=34 years old) that uses the CKD-EPI algorithm to adjust for age and sex. It is   not to be used for children, pregnant women, hospitalized patients,    patients on dialysis, or with rapidly changing kidney function. According to the NKDEP, eGFR >89 is normal, 60-89 shows mild impairment, 30-59 shows moderate impairment, 15-29 shows severe impairment and <15 is ESRD.      GFR calc non Af Amer  Date Value Ref Range Status  11/01/2019 85 >59 mL/min/1.73 Final         Passed - Valid encounter within last 6 months    Recent Outpatient Visits          1 month ago Hypertension  associated with diabetes [facility])   Primary Care at [facility] [place], [place]   2 months ago Acute conjunctivitis of left eye, unspecified acute conjunctivitis type   Primary Care at [facility], [place], MD   5 months ago Hypertension associated with diabetes [facility])   Primary Care at [facility], [place], MD   8 months ago Hypertension associated with diabetes [facility])   Primary Care at [facility], [place], MD   1 year ago Type 2 diabetes mellitus without complication, without long-term current use of insulin [facility])   Primary Care at [facility], In[place] Bloom[place], MD      Future Appointments            In 2 months Sagardia, Ines Bloomer, MD Primary Care at [place], [facility]

## 2020-03-06 ENCOUNTER — Ambulatory Visit: Payer: Medicare HMO | Admitting: Emergency Medicine

## 2020-04-26 ENCOUNTER — Other Ambulatory Visit: Payer: Self-pay | Admitting: Emergency Medicine

## 2020-04-26 DIAGNOSIS — E1159 Type 2 diabetes mellitus with other circulatory complications: Secondary | ICD-10-CM

## 2020-04-26 DIAGNOSIS — I152 Hypertension secondary to endocrine disorders: Secondary | ICD-10-CM

## 2020-04-27 ENCOUNTER — Telehealth: Payer: Self-pay | Admitting: Emergency Medicine

## 2020-04-27 NOTE — Telephone Encounter (Signed)
What is the name of the medication? JARDIANCE 10 MG TABS tablet [Pharmacy Med Name: JARDIANCE 10 MG TABLET] [[id number]] and TRULICITY 0.75 MG/0.5ML SOPN [Pharmacy Med Name: TRULICITY 0.75 MG/0.5 ML PEN] [[id number]    Have you contacted your pharmacy to request a refill? Yes, he needs an ov. He does have one scheduled with Neva Seat on 05/03/20.  Which pharmacy would you like this sent to?Pharmacy  [facility] [phone] [place], [place] - [phone] AT [facility]  [address] [place] [address]  Phone:  [phone] Fax:  [phone]  DEA #:  [id number]       Patient notified that their request is being sent to the clinical staff for review and that they should receive a call once it is complete. If they do not receive a call within 72 hours they can check with their pharmacy or our office.

## 2020-04-30 NOTE — Telephone Encounter (Signed)
Pt has d/c these two medications. Attempted to call pt to let him know. No answer

## 2020-05-01 ENCOUNTER — Other Ambulatory Visit: Payer: Self-pay

## 2020-05-01 ENCOUNTER — Telehealth: Payer: Self-pay | Admitting: Emergency Medicine

## 2020-05-01 ENCOUNTER — Other Ambulatory Visit: Payer: Self-pay | Admitting: Emergency Medicine

## 2020-05-01 DIAGNOSIS — E119 Type 2 diabetes mellitus without complications: Secondary | ICD-10-CM

## 2020-05-01 DIAGNOSIS — E1159 Type 2 diabetes mellitus with other circulatory complications: Secondary | ICD-10-CM

## 2020-05-01 DIAGNOSIS — I152 Hypertension secondary to endocrine disorders: Secondary | ICD-10-CM

## 2020-05-01 MED ORDER — LOVASTATIN 40 MG PO TABS: 40.0000 mg | ORAL_TABLET | Freq: Every day | ORAL | 0 refills | Status: DC

## 2020-05-01 MED ORDER — METFORMIN HCL 500 MG PO TABS: 1000.0000 mg | ORAL_TABLET | Freq: Two times a day (BID) | ORAL | 0 refills | Status: DC

## 2020-05-01 MED ORDER — GLIPIZIDE 5 MG PO TABS: 5.0000 mg | ORAL_TABLET | Freq: Two times a day (BID) | ORAL | 0 refills | Status: DC

## 2020-05-01 NOTE — Telephone Encounter (Signed)
Sent 30 days of each medication but pt does need follow up

## 2020-05-01 NOTE — Telephone Encounter (Signed)
Patient called to cancel appt with provider scheduled for 2/3. Positive covid test 1/31. Patient requesting medications be called into pharmacy.   lovastatin (MEVACOR) 40 MG tablet [[id number]]  quinapril (ACCUPRIL) 20 MG tablet [[id number]]   glipiZIDE (GLUCOTROL) 5 MG tablet [[id number]]   metFORMIN (GLUCOPHAGE) 500 MG tablet [[id number]]   empagliflozin (JARDIANCE) 10 MG TABS tablet [[id number]]    Dulaglutide (TRULICITY) 0.75 MG/0.5ML SOPN [[id number]]   Pharmacy:   [id number] [place], [place] - [phone] AT [facility]  [address] [place], [place] [address]  Phone:  [phone] Fax:  [phone]  DEA #:  [id number]

## 2020-05-01 NOTE — Telephone Encounter (Signed)
Spoke with patient. Tested positive for COVID. Patient will call back to schedule follow up in 2 weeks after he tests negative. Patient stated he discussed this with nurse earlier today.

## 2020-05-01 NOTE — Telephone Encounter (Signed)
Requested medication (s) are due for refill today: yes  Requested medication (s) are on the active medication list: yes  Last refill:  03/22/20  Future visit scheduled: no  Notes to clinic:  no valid encounter within last 6 months    Requested Prescriptions  Pending Prescriptions Disp Refills   glipiZIDE (GLUCOTROL) 5 MG tablet [Pharmacy Med Name: GLIPIZIDE 5 MG TABLET] 180 tablet 3    Sig: Take 1 tablet (5 mg total) by mouth 2 (two) times daily with a meal.      Endocrinology:  Diabetes - Sulfonylureas Failed - 05/01/2020  2:49 PM      Failed - HBA1C is between 0 and 7.9 and within 180 days    Hemoglobin A1C  Date Value Ref Range Status  11/01/2019 7.9 (A) 4.0 - 5.6 % Final   Hgb A1c MFr Bld  Date Value Ref Range Status  10/13/2018 7.7 (H) 4.8 - 5.6 % Final    Comment:             Prediabetes: 5.7 - 6.4          Diabetes: >6.4          Glycemic control for adults with diabetes: <7.0           Failed - Valid encounter within last 6 months    Recent Outpatient Visits           6 months ago Hypertension associated with diabetes [facility])   Primary Care at [place], [place], MD   6 months ago Acute conjunctivitis of left eye, unspecified acute conjunctivitis type   Primary Care at [facility], [place], MD   9 months ago Hypertension associated with diabetes [facility])   Primary Care at [facility], Eilleen [place]pf, MD   1 year ago Hypertension associated with diabetes [facility])   Primary Care at [place], [place], MD   1 year ago Type 2 diabetes mellitus without complication, without long-term current use of insulin [facility])   Primary Care at [facility], [place], MD

## 2020-05-03 ENCOUNTER — Ambulatory Visit: Payer: Medicare HMO | Admitting: Emergency Medicine

## 2020-05-24 ENCOUNTER — Other Ambulatory Visit: Payer: Self-pay | Admitting: Emergency Medicine

## 2020-05-24 DIAGNOSIS — I152 Hypertension secondary to endocrine disorders: Secondary | ICD-10-CM

## 2020-05-24 DIAGNOSIS — E119 Type 2 diabetes mellitus without complications: Secondary | ICD-10-CM

## 2020-05-24 DIAGNOSIS — E1159 Type 2 diabetes mellitus with other circulatory complications: Secondary | ICD-10-CM

## 2020-05-24 NOTE — Telephone Encounter (Signed)
Requested medications are due for refill today yes  Requested medications are on the active medication list yes  Last refill 12/23  Last visit 10/2019  Future visit scheduled no  Notes to clinic Was to return in 3-4 months, had an appt for 6 months but canceled and now does not have an upcoming appt. Please assess

## 2020-05-29 ENCOUNTER — Ambulatory Visit: Payer: Medicare Other | Admitting: Emergency Medicine

## 2020-05-29 ENCOUNTER — Other Ambulatory Visit: Payer: Self-pay

## 2020-05-29 ENCOUNTER — Encounter: Payer: Self-pay | Admitting: Emergency Medicine

## 2020-05-29 VITALS — BP 112/71 | HR 90 | Temp 98.6°F | Resp 16 | Ht 76.0 in | Wt 243.0 lb

## 2020-05-29 DIAGNOSIS — E1169 Type 2 diabetes mellitus with other specified complication: Secondary | ICD-10-CM

## 2020-05-29 DIAGNOSIS — I152 Hypertension secondary to endocrine disorders: Secondary | ICD-10-CM

## 2020-05-29 DIAGNOSIS — E785 Hyperlipidemia, unspecified: Secondary | ICD-10-CM

## 2020-05-29 DIAGNOSIS — E119 Type 2 diabetes mellitus without complications: Secondary | ICD-10-CM

## 2020-05-29 DIAGNOSIS — E1159 Type 2 diabetes mellitus with other circulatory complications: Secondary | ICD-10-CM

## 2020-05-29 LAB — POCT GLYCOSYLATED HEMOGLOBIN (HGB A1C): Hemoglobin A1C: 7.7 % — AB (ref 4.0–5.6)

## 2020-05-29 LAB — GLUCOSE, POCT (MANUAL RESULT ENTRY): POC Glucose: 154 mg/dl — AB (ref 70–99)

## 2020-05-29 MED ORDER — METFORMIN HCL 500 MG PO TABS
1000.0000 mg | ORAL_TABLET | Freq: Two times a day (BID) | ORAL | 1 refills | Status: DC
Start: 2020-05-29 — End: 2021-01-28

## 2020-05-29 MED ORDER — QUINAPRIL HCL 20 MG PO TABS: 20.0000 mg | ORAL_TABLET | Freq: Every day | ORAL | 3 refills | Status: DC

## 2020-05-29 MED ORDER — LOVASTATIN 40 MG PO TABS: 40.0000 mg | ORAL_TABLET | Freq: Every day | ORAL | 3 refills | Status: DC

## 2020-05-29 MED ORDER — GLIPIZIDE 5 MG PO TABS: 5.0000 mg | ORAL_TABLET | Freq: Two times a day (BID) | ORAL | 3 refills | Status: DC

## 2020-05-29 NOTE — Progress Notes (Signed)
Christian Harrington 65 y.o.   Chief Complaint  Patient presents with  . Diabetes    Follow up 4 month    HISTORY OF PRESENT ILLNESS: This is a 65 y.o. male with history of diabetes and hypertension here for follow-up.  Presently on Metformin, glipizide, and Jardiance. Also taking Accupril 20 mg daily for renal protection. Taking lovastatin 40 mg daily. Has no complaints or medical concerns today.  Doing well. Had COVID infection last January.  No complications.  Recovering well.  HPI   Prior to Admission medications   Medication Sig Start Date End Date Taking? Authorizing Provider  aspirin 325 MG tablet Take 325 mg by mouth every other day.     [provider]  blood glucose meter kit and supplies KIT Dispense per patient and insurance preference. Use up to four times daily FOR ICD-9 250.00, 250.01 06/11/16   Alveda Reasons, MD  Cholecalciferol (VITAMIN D3) 400 units CAPS Take 500 Units by mouth.    [provider]  Cinnamon 500 MG TABS Take 1 tablet by mouth.    [provider]  clotrimazole-betamethasone (LOTRISONE) cream Apply 1 application topically 2 (two) times daily. 11/01/19   Horald Pollen, MD  Forskolin POWD by Does not apply route.    [provider]  Garcinia Cambogia-Chromium 500-200 MG-MCG TABS Take 1 tablet by mouth.    [provider]  glipiZIDE (GLUCOTROL) 5 MG tablet TAKE 1 TABLET (5 MG TOTAL) BY MOUTH 2 (TWO) TIMES DAILY WITH A MEAL. 05/25/20 06/24/20  Horald Pollen, MD  glucose blood test strip Use as instructed 09/15/17   Jaynee Eagles, PA-C  JARDIANCE 10 MG TABS tablet TAKE 1 TABLET BY MOUTH DAILY BEFORE BREAKFAST. 05/25/20   Horald Pollen, MD  lovastatin (MEVACOR) 40 MG tablet Take 1 tablet (40 mg total) by mouth daily. 05/01/20   Horald Pollen, MD  metFORMIN (GLUCOPHAGE) 500 MG tablet TAKE 2 TABLETS (1,000 MG TOTAL) BY MOUTH 2 (TWO) TIMES DAILY WITH A MEAL. 05/25/20   Horald Pollen, MD   nabumetone (RELAFEN) 500 MG tablet Take 1 tablet (500 mg total) by mouth 2 (two) times daily as needed. Patient not taking: Reported on 10/06/2019 01/01/18   [name], Legrand Como, MD  naproxen (NAPROSYN) 500 MG tablet Take 1 tablet (500 mg total) by mouth 2 (two) times daily with a meal. Take only as needed. 10/13/18   Horald Pollen, MD  Omega-3 Fatty Acids (FISH OIL) 1000 MG CAPS Take 1 capsule by mouth.    [provider]  quinapril (ACCUPRIL) 20 MG tablet TAKE 1 TABLET BY MOUTH EVERY DAY 10/05/19   Horald Pollen, MD  tiZANidine (ZANAFLEX) 2 MG tablet Take 1-2 tablets (2-4 mg total) by mouth every 6 (six) hours as needed for muscle spasms. Patient not taking: Reported on 10/06/2019 01/01/18   [name], Legrand Como, MD    No Known Allergies  Patient Active Problem List   Diagnosis Date Noted  . Dyslipidemia associated with type 2 diabetes mellitus ([place]) 03/29/2015  . Hyperlipidemia 03/29/2015  . Hypertension associated with diabetes ([place]) 03/29/2015    Past Medical History:  Diagnosis Date  . Diabetes mellitus without complication [facility])     Past Surgical History:  Procedure Laterality Date  . CATARACT EXTRACTION Bilateral   . SHOULDER ARTHROSCOPY Right 2011  . TONSILLECTOMY      Social History   Socioeconomic History  . Marital status: Married    Spouse name: Not on file  .  Number of children: 4  . Years of education: Not on file  . Highest education level: Not on file  Occupational History  . Occupation: Scientist, product/process development    Comment: Production designer, theatre/television/film  Tobacco Use  . Smoking status: Former Smoker    Quit date: 04/01/1991    Years since quitting: 29.1  . Smokeless tobacco: Never Used  Substance and Sexual Activity  . Alcohol use: No    Alcohol/week: 0.0 standard drinks  . Drug use: No  . Sexual activity: Not on file  Other Topics Concern  . Not on file  Social History Narrative   Lives with his wife, who is on disability.   4 adult children live  independently, scattered across the country.   Social Determinants of Health   Financial Resource Strain: Not on file  Food Insecurity: Not on file  Transportation Needs: Not on file  Physical Activity: Not on file  Stress: Not on file  Social Connections: Not on file  Intimate Partner Violence: Not on file    Family History  Problem Relation Age of Onset  . Heart disease Father 44       CHF  . Diabetes Sister   . Arthritis Mother        OA, DDD  . Osteoporosis Mother   . Diabetes Maternal Grandmother      Review of Systems  Constitutional: Negative.  Negative for chills and fever.  HENT: Negative.  Negative for congestion and sore throat.   Respiratory: Negative.  Negative for cough and shortness of breath.   Cardiovascular: Negative.  Negative for chest pain and palpitations.  Gastrointestinal: Negative.  Negative for abdominal pain, diarrhea, nausea and vomiting.  Genitourinary: Negative.  Negative for hematuria.  Skin: Negative.  Negative for rash.  Neurological: Negative.  Negative for dizziness and headaches.  All other systems reviewed and are negative.   Today's Vitals   05/29/20 1335  BP: 112/71  Pulse: 90  Resp: 16  Temp: 98.6 F (37 C)  TempSrc: Temporal  SpO2: 94%  Weight: 243 lb (110.2 kg)  Height: 6' 4"  (1.93 m)   Body mass index is 29.58 kg/m. Wt Readings from Last 3 Encounters:  05/29/20 243 lb (110.2 kg)  11/01/19 242 lb (109.8 kg)  10/06/19 246 lb (111.6 kg)    Physical Exam Vitals reviewed.  Constitutional:      Appearance: Normal appearance.  Eyes:     Extraocular Movements: Extraocular movements intact.     Pupils: Pupils are equal, round, and reactive to light.  Cardiovascular:     Rate and Rhythm: Normal rate.  Pulmonary:     Effort: Pulmonary effort is normal.  Musculoskeletal:        General: Normal range of motion.     Cervical back: Normal range of motion.  Skin:    General: Skin is warm and dry.     Capillary Refill:  Capillary refill takes less than 2 seconds.  Neurological:     General: No focal deficit present.     Mental Status: He is alert and oriented to person, place, and time.  Psychiatric:        Mood and Affect: Mood normal.        Behavior: Behavior normal.     Results for orders placed or performed in visit on 05/29/20 (from the past 24 hour(s))  POCT glucose (manual entry)     Status: Abnormal   Collection Time: 05/29/20  2:00 PM  Result Value Ref  Range   POC Glucose 154 (A) 70 - 99 mg/dl  POCT glycosylated hemoglobin (Hb A1C)     Status: Abnormal   Collection Time: 05/29/20  2:05 PM  Result Value Ref Range   Hemoglobin A1C 7.7 (A) 4.0 - 5.6 %   HbA1c POC (<> result, manual entry)     HbA1c, POC (prediabetic range)     HbA1c, POC (controlled diabetic range)      ASSESSMENT & PLAN: Hypertension associated with diabetes ([place]) Well-controlled hypertension.  Continue Accupril 20 mg daily. Uncontrolled diabetes with hemoglobin A1c of 7.9.  Patient prefers to stay on current regimen with Metformin, glipizide, and Jardiance and reassess in 6 months. Diet and nutrition discussed. Continue lovastatin 40 mg daily.  Deronte was seen today for diabetes.  Diagnoses and all orders for this visit:  Hypertension associated with diabetes ([place]) -     CMP14+EGFR -     POCT glucose (manual entry) -     POCT glycosylated hemoglobin (Hb A1C) -     glipiZIDE (GLUCOTROL) 5 MG tablet; Take 1 tablet (5 mg total) by mouth 2 (two) times daily with a meal.  Dyslipidemia associated with type 2 diabetes mellitus ([place]) -     Lipid panel  Type 2 diabetes mellitus without complication, without long-term current use of insulin ([facility]) -     lovastatin (MEVACOR) 40 MG tablet; Take 1 tablet (40 mg total) by mouth daily. -     metFORMIN (GLUCOPHAGE) 500 MG tablet; Take 2 tablets (1,000 mg total) by mouth 2 (two) times daily with a meal. -     quinapril (ACCUPRIL) 20 MG tablet; Take 1 tablet (20 mg total) by  mouth daily.    Patient Instructions   Diabetes Mellitus and Nutrition, Adult When you have diabetes, or diabetes mellitus, it is very important to have healthy eating habits because your blood sugar (glucose) levels are greatly affected by what you eat and drink. Eating healthy foods in the right amounts, at about the same times every day, can help you:  Control your blood glucose.  Lower your risk of heart disease.  Improve your blood pressure.  Reach or maintain a healthy weight. What can affect my meal plan? Every person with diabetes is different, and each person has different needs for a meal plan. Your health care provider may recommend that you work with a dietitian to make a meal plan that is best for you. Your meal plan may vary depending on factors such as:  The calories you need.  The medicines you take.  Your weight.  Your blood glucose, blood pressure, and cholesterol levels.  Your activity level.  Other health conditions you have, such as heart or kidney disease. How do carbohydrates affect me? Carbohydrates, also called carbs, affect your blood glucose level more than any other type of food. Eating carbs naturally raises the amount of glucose in your blood. Carb counting is a method for keeping track of how many carbs you eat. Counting carbs is important to keep your blood glucose at a healthy level, especially if you use insulin or take certain oral diabetes medicines. It is important to know how many carbs you can safely have in each meal. This is different for every person. Your dietitian can help you calculate how many carbs you should have at each meal and for each snack. How does alcohol affect me? Alcohol can cause a sudden decrease in blood glucose (hypoglycemia), especially if you use insulin or take certain  oral diabetes medicines. Hypoglycemia can be a life-threatening condition. Symptoms of hypoglycemia, such as sleepiness, dizziness, and confusion, are  similar to symptoms of having too much alcohol.  Do not drink alcohol if: ? Your health care provider tells you not to drink. ? You are pregnant, may be pregnant, or are planning to become pregnant.  If you drink alcohol: ? Do not drink on an empty stomach. ? Limit how much you use to:  0-1 drink a day for women.  0-2 drinks a day for men. ? Be aware of how much alcohol is in your drink. In the [place] one drink equals one 12 oz bottle of beer (355 mL), one 5 oz glass of wine (148 mL), or one 1 oz glass of hard liquor (44 mL). ? Keep yourself hydrated with water, diet soda, or unsweetened iced tea.  Keep in mind that regular soda, juice, and other mixers may contain a lot of sugar and must be counted as carbs. What are tips for following this plan? Reading food labels  Start by checking the serving size on the "Nutrition Facts" label of packaged foods and drinks. The amount of calories, carbs, fats, and other nutrients listed on the label is based on one serving of the item. Many items contain more than one serving per package.  Check the total grams (g) of carbs in one serving. You can calculate the number of servings of carbs in one serving by dividing the total carbs by 15. For example, if a food has 30 g of total carbs per serving, it would be equal to 2 servings of carbs.  Check the number of grams (g) of saturated fats and trans fats in one serving. Choose foods that have a low amount or none of these fats.  Check the number of milligrams (mg) of salt (sodium) in one serving. Most people should limit total sodium intake to less than 2,300 mg per day.  Always check the nutrition information of foods labeled as "low-fat" or "nonfat." These foods may be higher in added sugar or refined carbs and should be avoided.  Talk to your dietitian to identify your daily goals for nutrients listed on the label. Shopping  Avoid buying canned, pre-made, or processed foods. These foods tend to  be high in fat, sodium, and added sugar.  Shop around the outside edge of the grocery store. This is where you will most often find fresh fruits and vegetables, bulk grains, fresh meats, and fresh dairy. Cooking  Use low-heat cooking methods, such as baking, instead of high-heat cooking methods like deep frying.  Cook using healthy oils, such as olive, canola, or sunflower oil.  Avoid cooking with butter, cream, or high-fat meats. Meal planning  Eat meals and snacks regularly, preferably at the same times every day. Avoid going long periods of time without eating.  Eat foods that are high in fiber, such as fresh fruits, vegetables, beans, and whole grains. Talk with your dietitian about how many servings of carbs you can eat at each meal.  Eat 4-6 oz (112-168 g) of lean protein each day, such as lean meat, chicken, fish, eggs, or tofu. One ounce (oz) of lean protein is equal to: ? 1 oz (28 g) of meat, chicken, or fish. ? 1 egg. ?  cup (62 g) of tofu.  Eat some foods each day that contain healthy fats, such as avocado, nuts, seeds, and fish.   What foods should I eat? Fruits Berries. Apples. Oranges. Peaches.  Apricots. Plums. Grapes. Mango. Papaya. Pomegranate. Kiwi. Cherries. Vegetables Lettuce. Spinach. Leafy greens, including kale, chard, collard greens, and mustard greens. Beets. Cauliflower. Cabbage. Broccoli. Carrots. Green beans. Tomatoes. Peppers. Onions. Cucumbers. Brussels sprouts. Grains Whole grains, such as whole-wheat or whole-grain bread, crackers, tortillas, cereal, and pasta. Unsweetened oatmeal. Quinoa. Brown or wild rice. Meats and other proteins Seafood. Poultry without skin. Lean cuts of poultry and beef. Tofu. Nuts. Seeds. Dairy Low-fat or fat-free dairy products such as milk, yogurt, and cheese. The items listed above may not be a complete list of foods and beverages you can eat. Contact a dietitian for more information. What foods should I  avoid? Fruits Fruits canned with syrup. Vegetables Canned vegetables. Frozen vegetables with butter or cream sauce. Grains Refined white flour and flour products such as bread, pasta, snack foods, and cereals. Avoid all processed foods. Meats and other proteins Fatty cuts of meat. Poultry with skin. Breaded or fried meats. Processed meat. Avoid saturated fats. Dairy Full-fat yogurt, cheese, or milk. Beverages Sweetened drinks, such as soda or iced tea. The items listed above may not be a complete list of foods and beverages you should avoid. Contact a dietitian for more information. Questions to ask a health care provider  Do I need to meet with a diabetes educator?  Do I need to meet with a dietitian?  What number can I call if I have questions?  When are the best times to check my blood glucose? Where to find more information:  American Diabetes Association: diabetes.org  Academy of Nutrition and Dietetics: www.eatright.[employer] of Diabetes and Digestive and Kidney Diseases: [employer]  Association of Diabetes Care and Education Specialists: www.diabeteseducator.org Summary  It is important to have healthy eating habits because your blood sugar (glucose) levels are greatly affected by what you eat and drink.  A healthy meal plan will help you control your blood glucose and maintain a healthy lifestyle.  Your health care provider may recommend that you work with a dietitian to make a meal plan that is best for you.  Keep in mind that carbohydrates (carbs) and alcohol have immediate effects on your blood glucose levels. It is important to count carbs and to use alcohol carefully. This information is not intended to replace advice given to you by your health care provider. Make sure you discuss any questions you have with your health care provider. Document Revised: 02/22/2019 Document Reviewed: 02/22/2019 Elsevier Patient Education  2021 [employer].     If you have lab work done today you will be contacted with your lab results within the next 2 weeks.  If you have not heard from [place] then please contact us. The fastest way to get your results is to register for My Chart.   IF you received an x-ray today, you will receive an invoice from [facility]. Please contact [facility] at [phone] with questions or concerns regarding your invoice.   IF you received labwork today, you will receive an invoice from [place]. Please contact [facility] at [phone] with questions or concerns regarding your invoice.   Our billing staff will not be able to assist you with questions regarding bills from these companies.  You will be contacted with the lab results as soon as they are available. The fastest way to get your results is to activate your My Chart account. Instructions are located on the last page of this paperwork. If you have not heard from [place] regarding the results in 2 weeks,  please contact this office.         Agustina Caroli, MD [facility]

## 2020-05-29 NOTE — Assessment & Plan Note (Signed)
Well-controlled hypertension.  Continue Accupril 20 mg daily. Uncontrolled diabetes with hemoglobin A1c of 7.9.  Patient prefers to stay on current regimen with Metformin, glipizide, and Jardiance and reassess in 6 months. Diet and nutrition discussed. Continue lovastatin 40 mg daily.

## 2020-05-29 NOTE — Patient Instructions (Addendum)
  Diabetes Mellitus and Nutrition, Adult When you have diabetes, or diabetes mellitus, it is very important to have healthy eating habits because your blood sugar (glucose) levels are greatly affected by what you eat and drink. Eating healthy foods in the right amounts, at about the same times every day, can help you:  Control your blood glucose.  Lower your risk of heart disease.  Improve your blood pressure.  Reach or maintain a healthy weight. What can affect my meal plan? Every person with diabetes is different, and each person has different needs for a meal plan. Your health care provider may recommend that you work with a dietitian to make a meal plan that is best for you. Your meal plan may vary depending on factors such as:  The calories you need.  The medicines you take.  Your weight.  Your blood glucose, blood pressure, and cholesterol levels.  Your activity level.  Other health conditions you have, such as heart or kidney disease. How do carbohydrates affect me? Carbohydrates, also called carbs, affect your blood glucose level more than any other type of food. Eating carbs naturally raises the amount of glucose in your blood. Carb counting is a method for keeping track of how many carbs you eat. Counting carbs is important to keep your blood glucose at a healthy level, especially if you use insulin or take certain oral diabetes medicines. It is important to know how many carbs you can safely have in each meal. This is different for every person. Your dietitian can help you calculate how many carbs you should have at each meal and for each snack. How does alcohol affect me? Alcohol can cause a sudden decrease in blood glucose (hypoglycemia), especially if you use insulin or take certain oral diabetes medicines. Hypoglycemia can be a life-threatening condition. Symptoms of hypoglycemia, such as sleepiness, dizziness, and confusion, are similar to symptoms of having too much  alcohol.  Do not drink alcohol if: ? Your health care provider tells you not to drink. ? You are pregnant, may be pregnant, or are planning to become pregnant.  If you drink alcohol: ? Do not drink on an empty stomach. ? Limit how much you use to:  0-1 drink a day for women.  0-2 drinks a day for men. ? Be aware of how much alcohol is in your drink. In the [place] one drink equals one 12 oz bottle of beer (355 mL), one 5 oz glass of wine (148 mL), or one 1 oz glass of hard liquor (44 mL). ? Keep yourself hydrated with water, diet soda, or unsweetened iced tea.  Keep in mind that regular soda, juice, and other mixers may contain a lot of sugar and must be counted as carbs. What are tips for following this plan? Reading food labels  Start by checking the serving size on the "Nutrition Facts" label of packaged foods and drinks. The amount of calories, carbs, fats, and other nutrients listed on the label is based on one serving of the item. Many items contain more than one serving per package.  Check the total grams (g) of carbs in one serving. You can calculate the number of servings of carbs in one serving by dividing the total carbs by 15. For example, if a food has 30 g of total carbs per serving, it would be equal to 2 servings of carbs.  Check the number of grams (g) of saturated fats and trans fats in one serving. Choose foods   that have a low amount or none of these fats.  Check the number of milligrams (mg) of salt (sodium) in one serving. Most people should limit total sodium intake to less than 2,300 mg per day.  Always check the nutrition information of foods labeled as "low-fat" or "nonfat." These foods may be higher in added sugar or refined carbs and should be avoided.  Talk to your dietitian to identify your daily goals for nutrients listed on the label. Shopping  Avoid buying canned, pre-made, or processed foods. These foods tend to be high in fat, sodium, and added  sugar.  Shop around the outside edge of the grocery store. This is where you will most often find fresh fruits and vegetables, bulk grains, fresh meats, and fresh dairy. Cooking  Use low-heat cooking methods, such as baking, instead of high-heat cooking methods like deep frying.  Cook using healthy oils, such as olive, canola, or sunflower oil.  Avoid cooking with butter, cream, or high-fat meats. Meal planning  Eat meals and snacks regularly, preferably at the same times every day. Avoid going long periods of time without eating.  Eat foods that are high in fiber, such as fresh fruits, vegetables, beans, and whole grains. Talk with your dietitian about how many servings of carbs you can eat at each meal.  Eat 4-6 oz (112-168 g) of lean protein each day, such as lean meat, chicken, fish, eggs, or tofu. One ounce (oz) of lean protein is equal to: ? 1 oz (28 g) of meat, chicken, or fish. ? 1 egg. ?  cup (62 g) of tofu.  Eat some foods each day that contain healthy fats, such as avocado, nuts, seeds, and fish.   What foods should I eat? Fruits Berries. Apples. Oranges. Peaches. Apricots. Plums. Grapes. Mango. Papaya. Pomegranate. Kiwi. Cherries. Vegetables Lettuce. Spinach. Leafy greens, including kale, chard, collard greens, and mustard greens. Beets. Cauliflower. Cabbage. Broccoli. Carrots. Green beans. Tomatoes. Peppers. Onions. Cucumbers. Brussels sprouts. Grains Whole grains, such as whole-wheat or whole-grain bread, crackers, tortillas, cereal, and pasta. Unsweetened oatmeal. Quinoa. Brown or wild rice. Meats and other proteins Seafood. Poultry without skin. Lean cuts of poultry and beef. Tofu. Nuts. Seeds. Dairy Low-fat or fat-free dairy products such as milk, yogurt, and cheese. The items listed above may not be a complete list of foods and beverages you can eat. Contact a dietitian for more information. What foods should I avoid? Fruits Fruits canned with  syrup. Vegetables Canned vegetables. Frozen vegetables with butter or cream sauce. Grains Refined white flour and flour products such as bread, pasta, snack foods, and cereals. Avoid all processed foods. Meats and other proteins Fatty cuts of meat. Poultry with skin. Breaded or fried meats. Processed meat. Avoid saturated fats. Dairy Full-fat yogurt, cheese, or milk. Beverages Sweetened drinks, such as soda or iced tea. The items listed above may not be a complete list of foods and beverages you should avoid. Contact a dietitian for more information. Questions to ask a health care provider  Do I need to meet with a diabetes educator?  Do I need to meet with a dietitian?  What number can I call if I have questions?  When are the best times to check my blood glucose? Where to find more information:  American Diabetes Association: diabetes.org  Academy of Nutrition and Dietetics: www.eatright.org  National Institute of Diabetes and Digestive and Kidney Diseases: www.niddk.nih.gov  Association of Diabetes Care and Education Specialists: www.diabeteseducator.org Summary  It is important to have   healthy eating habits because your blood sugar (glucose) levels are greatly affected by what you eat and drink.  A healthy meal plan will help you control your blood glucose and maintain a healthy lifestyle.  Your health care provider may recommend that you work with a dietitian to make a meal plan that is best for you.  Keep in mind that carbohydrates (carbs) and alcohol have immediate effects on your blood glucose levels. It is important to count carbs and to use alcohol carefully. This information is not intended to replace advice given to you by your health care provider. Make sure you discuss any questions you have with your health care provider. Document Revised: 02/22/2019 Document Reviewed: 02/22/2019 Elsevier Patient Education  2021 [employer]vier Inc.   If you have lab work done  today you will be contacted with your lab results within the next 2 weeks.  If you have not heard from us then please contact us. The fastest way to get your results is to register for My Chart.   IF you received an x-ray today, you will receive an invoice from [facility]. Please contact [facility] at [phone] with questions or concerns regarding your invoice.   IF you received labwork today, you will receive an invoice from [facility]. Please contact [facility] at [phone] with questions or concerns regarding your invoice.   Our billing staff will not be able to assist you with questions regarding bills from these companies.  You will be contacted with the lab results as soon as they are available. The fastest way to get your results is to activate your My Chart account. Instructions are located on the last page of this paperwork. If you have not heard from us regarding the results in 2 weeks, please contact this office.      

## 2020-05-30 LAB — CMP14+EGFR
ALT: 24 IU/L (ref 0–44)
AST: 13 IU/L (ref 0–40)
Albumin/Globulin Ratio: 1.8 (ref 1.2–2.2)
Albumin: 4.7 g/dL (ref 3.8–4.8)
Alkaline Phosphatase: 92 IU/L (ref 44–121)
BUN/Creatinine Ratio: 15 (ref 10–24)
BUN: 13 mg/dL (ref 8–27)
Bilirubin Total: 0.5 mg/dL (ref 0.0–1.2)
CO2: 21 mmol/L (ref 20–29)
Calcium: 9.8 mg/dL (ref 8.6–10.2)
Chloride: 102 mmol/L (ref 96–106)
Creatinine, Ser: 0.89 mg/dL (ref 0.76–1.27)
Globulin, Total: 2.6 g/dL (ref 1.5–4.5)
Glucose: 152 mg/dL — ABNORMAL HIGH (ref 65–99)
Potassium: 4.5 mmol/L (ref 3.5–5.2)
Sodium: 139 mmol/L (ref 134–144)
Total Protein: 7.3 g/dL (ref 6.0–8.5)
eGFR: 95 mL/min/{1.73_m2} (ref >59)

## 2020-05-30 LAB — LIPID PANEL
Chol/HDL Ratio: 4.5 ratio (ref 0.0–5.0)
Cholesterol, Total: 147 mg/dL (ref 100–199)
HDL: 33 mg/dL — ABNORMAL LOW (ref >39)
LDL Chol Calc (NIH): 93 mg/dL (ref 0–99)
Triglycerides: 115 mg/dL (ref 0–149)
VLDL Cholesterol Cal: 21 mg/dL (ref 5–40)

## 2020-08-19 ENCOUNTER — Other Ambulatory Visit: Payer: Self-pay | Admitting: Emergency Medicine

## 2020-11-18 ENCOUNTER — Other Ambulatory Visit: Payer: Self-pay | Admitting: Emergency Medicine

## 2020-12-04 ENCOUNTER — Encounter: Payer: Self-pay | Admitting: Emergency Medicine

## 2020-12-04 ENCOUNTER — Other Ambulatory Visit: Payer: Self-pay

## 2020-12-04 ENCOUNTER — Ambulatory Visit: Payer: Medicare Other | Admitting: Emergency Medicine

## 2020-12-04 VITALS — BP 136/78 | HR 70 | Temp 98.0°F

## 2020-12-04 DIAGNOSIS — I152 Hypertension secondary to endocrine disorders: Secondary | ICD-10-CM

## 2020-12-04 DIAGNOSIS — E1159 Type 2 diabetes mellitus with other circulatory complications: Secondary | ICD-10-CM | POA: Diagnosis not present

## 2020-12-04 DIAGNOSIS — E1169 Type 2 diabetes mellitus with other specified complication: Secondary | ICD-10-CM

## 2020-12-04 DIAGNOSIS — E785 Hyperlipidemia, unspecified: Secondary | ICD-10-CM

## 2020-12-04 LAB — POCT GLYCOSYLATED HEMOGLOBIN (HGB A1C): Hemoglobin A1C: 7.8 % — AB (ref 4.0–5.6)

## 2020-12-04 MED ORDER — OZEMPIC (0.25 OR 0.5 MG/DOSE) 2 MG/1.5ML ~~LOC~~ SOPN: 0.5000 mg | PEN_INJECTOR | SUBCUTANEOUS | 3 refills | Status: DC

## 2020-12-04 NOTE — Patient Instructions (Signed)
Diabetes Mellitus and Nutrition, Adult When you have diabetes, or diabetes mellitus, it is very important to have healthy eating habits because your blood sugar (glucose) levels are greatly affected by what you eat and drink. Eating healthy foods in the right amounts, at about the same times every day, can help you:  Control your blood glucose.  Lower your risk of heart disease.  Improve your blood pressure.  Reach or maintain a healthy weight. What can affect my meal plan? Every person with diabetes is different, and each person has different needs for a meal plan. Your health care provider may recommend that you work with a dietitian to make a meal plan that is best for you. Your meal plan may vary depending on factors such as:  The calories you need.  The medicines you take.  Your weight.  Your blood glucose, blood pressure, and cholesterol levels.  Your activity level.  Other health conditions you have, such as heart or kidney disease. How do carbohydrates affect me? Carbohydrates, also called carbs, affect your blood glucose level more than any other type of food. Eating carbs naturally raises the amount of glucose in your blood. Carb counting is a method for keeping track of how many carbs you eat. Counting carbs is important to keep your blood glucose at a healthy level, especially if you use insulin or take certain oral diabetes medicines. It is important to know how many carbs you can safely have in each meal. This is different for every person. Your dietitian can help you calculate how many carbs you should have at each meal and for each snack. How does alcohol affect me? Alcohol can cause a sudden decrease in blood glucose (hypoglycemia), especially if you use insulin or take certain oral diabetes medicines. Hypoglycemia can be a life-threatening condition. Symptoms of hypoglycemia, such as sleepiness, dizziness, and confusion, are similar to symptoms of having too much  alcohol.  Do not drink alcohol if: ? Your health care provider tells you not to drink. ? You are pregnant, may be pregnant, or are planning to become pregnant.  If you drink alcohol: ? Do not drink on an empty stomach. ? Limit how much you use to:  0-1 drink a day for women.  0-2 drinks a day for men. ? Be aware of how much alcohol is in your drink. In the [place] one drink equals one 12 oz bottle of beer (355 mL), one 5 oz glass of wine (148 mL), or one 1 oz glass of hard liquor (44 mL). ? Keep yourself hydrated with water, diet soda, or unsweetened iced tea.  Keep in mind that regular soda, juice, and other mixers may contain a lot of sugar and must be counted as carbs. What are tips for following this plan? Reading food labels  Start by checking the serving size on the "Nutrition Facts" label of packaged foods and drinks. The amount of calories, carbs, fats, and other nutrients listed on the label is based on one serving of the item. Many items contain more than one serving per package.  Check the total grams (g) of carbs in one serving. You can calculate the number of servings of carbs in one serving by dividing the total carbs by 15. For example, if a food has 30 g of total carbs per serving, it would be equal to 2 servings of carbs.  Check the number of grams (g) of saturated fats and trans fats in one serving. Choose foods that have   a low amount or none of these fats.  Check the number of milligrams (mg) of salt (sodium) in one serving. Most people should limit total sodium intake to less than 2,300 mg per day.  Always check the nutrition information of foods labeled as "low-fat" or "nonfat." These foods may be higher in added sugar or refined carbs and should be avoided.  Talk to your dietitian to identify your daily goals for nutrients listed on the label. Shopping  Avoid buying canned, pre-made, or processed foods. These foods tend to be high in fat, sodium, and added  sugar.  Shop around the outside edge of the grocery store. This is where you will most often find fresh fruits and vegetables, bulk grains, fresh meats, and fresh dairy. Cooking  Use low-heat cooking methods, such as baking, instead of high-heat cooking methods like deep frying.  Cook using healthy oils, such as olive, canola, or sunflower oil.  Avoid cooking with butter, cream, or high-fat meats. Meal planning  Eat meals and snacks regularly, preferably at the same times every day. Avoid going long periods of time without eating.  Eat foods that are high in fiber, such as fresh fruits, vegetables, beans, and whole grains. Talk with your dietitian about how many servings of carbs you can eat at each meal.  Eat 4-6 oz (112-168 g) of lean protein each day, such as lean meat, chicken, fish, eggs, or tofu. One ounce (oz) of lean protein is equal to: ? 1 oz (28 g) of meat, chicken, or fish. ? 1 egg. ?  cup (62 g) of tofu.  Eat some foods each day that contain healthy fats, such as avocado, nuts, seeds, and fish.   What foods should I eat? Fruits Berries. Apples. Oranges. Peaches. Apricots. Plums. Grapes. Mango. Papaya. Pomegranate. Kiwi. Cherries. Vegetables Lettuce. Spinach. Leafy greens, including kale, chard, collard greens, and mustard greens. Beets. Cauliflower. Cabbage. Broccoli. Carrots. Green beans. Tomatoes. Peppers. Onions. Cucumbers. Brussels sprouts. Grains Whole grains, such as whole-wheat or whole-grain bread, crackers, tortillas, cereal, and pasta. Unsweetened oatmeal. Quinoa. Brown or wild rice. Meats and other proteins Seafood. Poultry without skin. Lean cuts of poultry and beef. Tofu. Nuts. Seeds. Dairy Low-fat or fat-free dairy products such as milk, yogurt, and cheese. The items listed above may not be a complete list of foods and beverages you can eat. Contact a dietitian for more information. What foods should I avoid? Fruits Fruits canned with  syrup. Vegetables Canned vegetables. Frozen vegetables with butter or cream sauce. Grains Refined white flour and flour products such as bread, pasta, snack foods, and cereals. Avoid all processed foods. Meats and other proteins Fatty cuts of meat. Poultry with skin. Breaded or fried meats. Processed meat. Avoid saturated fats. Dairy Full-fat yogurt, cheese, or milk. Beverages Sweetened drinks, such as soda or iced tea. The items listed above may not be a complete list of foods and beverages you should avoid. Contact a dietitian for more information. Questions to ask a health care provider  Do I need to meet with a diabetes educator?  Do I need to meet with a dietitian?  What number can I call if I have questions?  When are the best times to check my blood glucose? Where to find more information:  American Diabetes Association: diabetes.org  Academy of Nutrition and Dietetics: www.eatright.org  National Institute of Diabetes and Digestive and Kidney Diseases: www.niddk.nih.gov  Association of Diabetes Care and Education Specialists: www.diabeteseducator.org Summary  It is important to have healthy eating   habits because your blood sugar (glucose) levels are greatly affected by what you eat and drink.  A healthy meal plan will help you control your blood glucose and maintain a healthy lifestyle.  Your health care provider may recommend that you work with a dietitian to make a meal plan that is best for you.  Keep in mind that carbohydrates (carbs) and alcohol have immediate effects on your blood glucose levels. It is important to count carbs and to use alcohol carefully. This information is not intended to replace advice given to you by your health care provider. Make sure you discuss any questions you have with your health care provider. Document Revised: 02/22/2019 Document Reviewed: 02/22/2019 Elsevier Patient Education  2021 [employer] Inc.  

## 2020-12-04 NOTE — Progress Notes (Addendum)
Christian Harrington 65 y.o.   Chief Complaint  Patient presents with   Diabetes    Diabetic and BP follow up   Last office visit assessment and plan as follows: ASSESSMENT & PLAN: Hypertension associated with diabetes ([place]) Well-controlled hypertension.  Continue Accupril 20 mg daily. Uncontrolled diabetes with hemoglobin A1c of 7.9.  Patient prefers to stay on current regimen with Metformin, glipizide, and Jardiance and reassess in 6 months. Diet and nutrition discussed. Continue lovastatin 40 mg daily. HISTORY OF PRESENT ILLNESS: This is a 65 y.o. male with history of hypertension diabetes here for follow-up. Presently on Jardiance which is giving him a chronic intermittent groin rash.  On glipizide and metformin. Took Trulicity in the past with good results but had to stop due to insurance lack of coverage.  Recently changed insurances and inquiring about either Ozempic or Trulicity. Still having high morning glucose levels. History of hypertension on Accupril 20 mg daily.  Doing well.  Diabetes Pertinent negatives for hypoglycemia include no dizziness or headaches. Pertinent negatives for diabetes include no chest pain.    Prior to Admission medications   Medication Sig Start Date End Date Taking? Authorizing Provider  aspirin 325 MG tablet Take 325 mg by mouth every other day.    Yes [provider]  blood glucose meter kit and supplies KIT Dispense per patient and insurance preference. Use up to four times daily FOR ICD-9 250.00, 250.01 06/11/16  Yes Alveda Reasons, MD  Cholecalciferol (VITAMIN D3) 400 units CAPS Take 500 Units by mouth.   Yes [provider]  Cinnamon 500 MG TABS Take 1 tablet by mouth.   Yes [provider]  clotrimazole-betamethasone (LOTRISONE) cream Apply 1 application topically 2 (two) times daily. 11/01/19  Yes Bronsen Serano, Ines Bloomer, MD  Garcinia Cambogia-Chromium 500-200 MG-MCG TABS Take 1 tablet by mouth.   Yes [provider]  glipiZIDE (GLUCOTROL) 5 MG tablet Take 1 tablet (5 mg total) by mouth 2 (two) times daily with a meal. 05/29/20 12/04/20 Yes Lubna Stegeman, Ines Bloomer, MD  glucose blood test strip Use as instructed 09/15/17  Yes Jaynee Eagles, PA-C  JARDIANCE 10 MG TABS tablet TAKE 1 TABLET BY MOUTH EVERY DAY BEFORE BREAKFAST 11/19/20  Yes Keanthony Poole, Ines Bloomer, MD  lovastatin (MEVACOR) 40 MG tablet Take 1 tablet (40 mg total) by mouth daily. 05/29/20  Yes Cristiana Yochim, Ines Bloomer, MD  metFORMIN (GLUCOPHAGE) 500 MG tablet Take 2 tablets (1,000 mg total) by mouth 2 (two) times daily with a meal. 05/29/20  Yes Hailey Stormer, Ines Bloomer, MD  nabumetone (RELAFEN) 500 MG tablet Take 1 tablet (500 mg total) by mouth 2 (two) times daily as needed. 01/01/18  Yes [name], Legrand Como, MD  naproxen (NAPROSYN) 500 MG tablet Take 1 tablet (500 mg total) by mouth 2 (two) times daily with a meal. Take only as needed. 10/13/18  Yes Jochebed Bills, Ines Bloomer, MD  Omega-3 Fatty Acids (FISH OIL) 1000 MG CAPS Take 1 capsule by mouth.   Yes [provider]  quinapril (ACCUPRIL) 20 MG tablet Take 1 tablet (20 mg total) by mouth daily. 05/29/20  Yes Aadam Zhen, Ines Bloomer, MD  tiZANidine (ZANAFLEX) 2 MG tablet Take 1-2 tablets (2-4 mg total) by mouth every 6 (six) hours as needed for muscle spasms. 01/01/18  Yes [name], Legrand Como, MD  Forskolin POWD by Does not apply route. Patient not taking: No sig reported    [provider]    No Known Allergies  Patient Active Problem List  Diagnosis Date Noted   Dyslipidemia associated with type 2 diabetes mellitus ([place]) 03/29/2015   Hyperlipidemia 03/29/2015   Hypertension associated with diabetes ([place]) 03/29/2015    Past Medical History:  Diagnosis Date   Diabetes mellitus without complication ([place])     Past Surgical History:  Procedure Laterality Date   CATARACT EXTRACTION Bilateral    SHOULDER ARTHROSCOPY Right 2011   TONSILLECTOMY      Social History   Socioeconomic History    Marital status: Married    Spouse name: Not on file   Number of children: 4   Years of education: Not on file   Highest education level: Not on file  Occupational History   Occupation: Scientist, product/process development    Comment: Production designer, theatre/television/film  Tobacco Use   Smoking status: Former    Types: Cigarettes    Quit date: 04/01/1991    Years since quitting: 29.6   Smokeless tobacco: Never  Substance and Sexual Activity   Alcohol use: No    Alcohol/week: 0.0 standard drinks   Drug use: No   Sexual activity: Not on file  Other Topics Concern   Not on file  Social History Narrative   Lives with his wife, who is on disability.   4 adult children live independently, scattered across the country.   Social Determinants of Health   Financial Resource Strain: Not on file  Food Insecurity: Not on file  Transportation Needs: Not on file  Physical Activity: Not on file  Stress: Not on file  Social Connections: Not on file  Intimate Partner Violence: Not on file    Family History  Problem Relation Age of Onset   Heart disease Father 59       CHF   Diabetes Sister    Arthritis Mother        OA, DDD   Osteoporosis Mother    Diabetes Maternal Grandmother      Review of Systems  Constitutional: Negative.  Negative for chills and fever.  HENT: Negative.  Negative for congestion and sore throat.   Respiratory: Negative.  Negative for cough and shortness of breath.   Cardiovascular: Negative.  Negative for chest pain and palpitations.  Gastrointestinal:  Negative for abdominal pain, diarrhea, nausea and vomiting.  Genitourinary: Negative.  Negative for dysuria and hematuria.  Skin: Negative.  Negative for rash.  Neurological:  Negative for dizziness and headaches.  All other systems reviewed and are negative.  Today's Vitals   12/04/20 1659  BP: 136/78  Pulse: 70  Temp: 98 F (36.7 C)   There is no height or weight on file to calculate BMI.  Physical Exam Vitals reviewed.   Constitutional:      Appearance: Normal appearance.  HENT:     Head: Normocephalic.  Eyes:     Extraocular Movements: Extraocular movements intact.     Pupils: Pupils are equal, round, and reactive to light.  Cardiovascular:     Rate and Rhythm: Normal rate.  Pulmonary:     Effort: Pulmonary effort is normal.  Skin:    General: Skin is warm and dry.     Capillary Refill: Capillary refill takes less than 2 seconds.  Neurological:     General: No focal deficit present.     Mental Status: He is alert and oriented to person, place, and time.  Psychiatric:        Mood and Affect: Mood normal.        Behavior: Behavior normal.    Results  for orders placed or performed in visit on 12/04/20 (from the past 24 hour(s))  POCT glycosylated hemoglobin (Hb A1C)     Status: Abnormal   Collection Time: 12/04/20  3:43 PM  Result Value Ref Range   Hemoglobin A1C 7.8 (A) 4.0 - 5.6 %   HbA1c POC (<> result, manual entry)     HbA1c, POC (prediabetic range)     HbA1c, POC (controlled diabetic range)      ASSESSMENT & PLAN: A total of 45 minutes was spent with the patient and counseling/coordination of care regarding preparing for this visit, review of most recent office visit notes, review of most recent blood work results including today's hemoglobin A1c, review of all medications and changes made, cardiovascular risks associated with hypertension and diabetes, education on nutrition, prognosis, documentation and need for follow-up.  Hypertension associated with diabetes ([place]) Well-controlled hypertension continue Accupril 20 mg daily. Uncontrolled diabetes with hemoglobin A1c at 7.9 Continue metformin 1000 mg twice a day and glipizide 5 mg twice a day. Discontinue Jardiance due to chronic groin rash and start Ozempic 0.5 mg weekly. Continue monitoring blood glucose readings at home with target of 80-110. Diet and nutrition discussed. Patient prefers to follow-up in 6 months as opposed to 3  months.  Dyslipidemia associated with type 2 diabetes mellitus ([place]) Diet and nutrition discussed.  Continue lovastatin 40 mg daily. The 10-year ASCVD risk score Mikey Bussing DC Brooke Bonito., et al., 2013) is: 31.8%   Values used to calculate the score:     Age: 75 years     Sex: Male     Is Non-Hispanic African American: No     Diabetic: Yes     Tobacco smoker: No     Systolic Blood Pressure: 696 mmHg     Is BP treated: Yes     HDL Cholesterol: 33 mg/dL     Total Cholesterol: 147 mg/dL Christian Harrington was seen today for diabetes.  Diagnoses and all orders for this visit:  Hypertension associated with diabetes ([place]) -     POCT glycosylated hemoglobin (Hb A1C) -     Semaglutide,0.25 or 0.5MG/DOS, (OZEMPIC, 0.25 OR 0.5 MG/DOSE,) 2 MG/1.5ML SOPN; Inject 0.5 mg into the skin once a week.  Dyslipidemia associated with type 2 diabetes mellitus ([place])  Patient Instructions  Diabetes Mellitus and Nutrition, Adult When you have diabetes, or diabetes mellitus, it is very important to have healthy eating habits because your blood sugar (glucose) levels are greatly affected by what you eat and drink. Eating healthy foods in the right amounts, at about the same times every day, can help you: Control your blood glucose. Lower your risk of heart disease. Improve your blood pressure. Reach or maintain a healthy weight. What can affect my meal plan? Every person with diabetes is different, and each person has different needs for a meal plan. Your health care provider may recommend that you work with a dietitian to make a meal plan that is best for you. Your meal plan may vary depending on factors such as: The calories you need. The medicines you take. Your weight. Your blood glucose, blood pressure, and cholesterol levels. Your activity level. Other health conditions you have, such as heart or kidney disease. How do carbohydrates affect me? Carbohydrates, also called carbs, affect your blood glucose level more than any  other type of food. Eating carbs naturally raises the amount of glucose in your blood. Carb counting is a method for keeping track of how many carbs you eat. Counting  carbs is important to keep your blood glucose at a healthy level, especially if you use insulin or take certain oral diabetes medicines. It is important to know how many carbs you can safely have in each meal. This is different for every person. Your dietitian can help you calculate how many carbs you should have at each meal and for each snack. How does alcohol affect me? Alcohol can cause a sudden decrease in blood glucose (hypoglycemia), especially if you use insulin or take certain oral diabetes medicines. Hypoglycemia can be a life-threatening condition. Symptoms of hypoglycemia, such as sleepiness, dizziness, and confusion, are similar to symptoms of having too much alcohol. Do not drink alcohol if: Your health care provider tells you not to drink. You are pregnant, may be pregnant, or are planning to become pregnant. If you drink alcohol: Do not drink on an empty stomach. Limit how much you use to: 0-1 drink a day for women. 0-2 drinks a day for men. Be aware of how much alcohol is in your drink. In the [place] one drink equals one 12 oz bottle of beer (355 mL), one 5 oz glass of wine (148 mL), or one 1 oz glass of hard liquor (44 mL). Keep yourself hydrated with water, diet soda, or unsweetened iced tea. Keep in mind that regular soda, juice, and other mixers may contain a lot of sugar and must be counted as carbs. What are tips for following this plan? Reading food labels Start by checking the serving size on the "Nutrition Facts" label of packaged foods and drinks. The amount of calories, carbs, fats, and other nutrients listed on the label is based on one serving of the item. Many items contain more than one serving per package. Check the total grams (g) of carbs in one serving. You can calculate the number of servings of  carbs in one serving by dividing the total carbs by 15. For example, if a food has 30 g of total carbs per serving, it would be equal to 2 servings of carbs. Check the number of grams (g) of saturated fats and trans fats in one serving. Choose foods that have a low amount or none of these fats. Check the number of milligrams (mg) of salt (sodium) in one serving. Most people should limit total sodium intake to less than 2,300 mg per day. Always check the nutrition information of foods labeled as "low-fat" or "nonfat." These foods may be higher in added sugar or refined carbs and should be avoided. Talk to your dietitian to identify your daily goals for nutrients listed on the label. Shopping Avoid buying canned, pre-made, or processed foods. These foods tend to be high in fat, sodium, and added sugar. Shop around the outside edge of the grocery store. This is where you will most often find fresh fruits and vegetables, bulk grains, fresh meats, and fresh dairy. Cooking Use low-heat cooking methods, such as baking, instead of high-heat cooking methods like deep frying. Cook using healthy oils, such as olive, canola, or sunflower oil. Avoid cooking with butter, cream, or high-fat meats. Meal planning Eat meals and snacks regularly, preferably at the same times every day. Avoid going long periods of time without eating. Eat foods that are high in fiber, such as fresh fruits, vegetables, beans, and whole grains. Talk with your dietitian about how many servings of carbs you can eat at each meal. Eat 4-6 oz (112-168 g) of lean protein each day, such as lean meat, chicken, fish, eggs,  or tofu. One ounce (oz) of lean protein is equal to: 1 oz (28 g) of meat, chicken, or fish. 1 egg.  cup (62 g) of tofu. Eat some foods each day that contain healthy fats, such as avocado, nuts, seeds, and fish. What foods should I eat? Fruits Berries. Apples. Oranges. Peaches. Apricots. Plums. Grapes. Mango. Papaya.  Pomegranate. Kiwi. Cherries. Vegetables Lettuce. Spinach. Leafy greens, including kale, chard, collard greens, and mustard greens. Beets. Cauliflower. Cabbage. Broccoli. Carrots. Green beans. Tomatoes. Peppers. Onions. Cucumbers. Brussels sprouts. Grains Whole grains, such as whole-wheat or whole-grain bread, crackers, tortillas, cereal, and pasta. Unsweetened oatmeal. Quinoa. Brown or wild rice. Meats and other proteins Seafood. Poultry without skin. Lean cuts of poultry and beef. Tofu. Nuts. Seeds. Dairy Low-fat or fat-free dairy products such as milk, yogurt, and cheese. The items listed above may not be a complete list of foods and beverages you can eat. Contact a dietitian for more information. What foods should I avoid? Fruits Fruits canned with syrup. Vegetables Canned vegetables. Frozen vegetables with butter or cream sauce. Grains Refined white flour and flour products such as bread, pasta, snack foods, and cereals. Avoid all processed foods. Meats and other proteins Fatty cuts of meat. Poultry with skin. Breaded or fried meats. Processed meat. Avoid saturated fats. Dairy Full-fat yogurt, cheese, or milk. Beverages Sweetened drinks, such as soda or iced tea. The items listed above may not be a complete list of foods and beverages you should avoid. Contact a dietitian for more information. Questions to ask a health care provider Do I need to meet with a diabetes educator? Do I need to meet with a dietitian? What number can I call if I have questions? When are the best times to check my blood glucose? Where to find more information: American Diabetes Association: diabetes.org Academy of Nutrition and Dietetics: www.eatright.[employer] of Diabetes and Digestive and Kidney Diseases: [employer] Association of Diabetes Care and Education Specialists: www.diabeteseducator.org Summary It is important to have healthy eating habits because your blood sugar  (glucose) levels are greatly affected by what you eat and drink. A healthy meal plan will help you control your blood glucose and maintain a healthy lifestyle. Your health care provider may recommend that you work with a dietitian to make a meal plan that is best for you. Keep in mind that carbohydrates (carbs) and alcohol have immediate effects on your blood glucose levels. It is important to count carbs and to use alcohol carefully. This information is not intended to replace advice given to you by your health care provider. Make sure you discuss any questions you have with your health care provider. Document Revised: 02/22/2019 Document Reviewed: 02/22/2019 Elsevier Patient Education  2021 [place], MD [facility]

## 2020-12-04 NOTE — Assessment & Plan Note (Signed)
Diet and nutrition discussed.  Continue lovastatin 40 mg daily. The 10-year ASCVD risk score Denman George DC Montez Hageman., et al., 2013) is: 31.8%   Values used to calculate the score:     Age: 65 years     Sex: Male     Is Non-Hispanic African American: No     Diabetic: Yes     Tobacco smoker: No     Systolic Blood Pressure: 136 mmHg     Is BP treated: Yes     HDL Cholesterol: 33 mg/dL     Total Cholesterol: 147 mg/dL

## 2020-12-04 NOTE — Assessment & Plan Note (Signed)
Well-controlled hypertension continue Accupril 20 mg daily. Uncontrolled diabetes with hemoglobin A1c at 7.9 Continue metformin 1000 mg twice a day and glipizide 5 mg twice a day. Discontinue Jardiance due to chronic groin rash and start Ozempic 0.5 mg weekly. Continue monitoring blood glucose readings at home with target of 80-110. Diet and nutrition discussed. Patient prefers to follow-up in 6 months as opposed to 3 months.

## 2020-12-07 ENCOUNTER — Encounter: Payer: Self-pay | Admitting: Emergency Medicine

## 2020-12-12 ENCOUNTER — Encounter: Payer: Self-pay | Admitting: Emergency Medicine

## 2020-12-12 ENCOUNTER — Telehealth (INDEPENDENT_AMBULATORY_CARE_PROVIDER_SITE_OTHER): Payer: Medicare Other | Admitting: Emergency Medicine

## 2020-12-12 DIAGNOSIS — R4582 Worries: Secondary | ICD-10-CM

## 2020-12-12 DIAGNOSIS — I152 Hypertension secondary to endocrine disorders: Secondary | ICD-10-CM

## 2020-12-12 DIAGNOSIS — E1169 Type 2 diabetes mellitus with other specified complication: Secondary | ICD-10-CM

## 2020-12-12 DIAGNOSIS — E785 Hyperlipidemia, unspecified: Secondary | ICD-10-CM

## 2020-12-12 DIAGNOSIS — E1159 Type 2 diabetes mellitus with other circulatory complications: Secondary | ICD-10-CM

## 2020-12-12 NOTE — Telephone Encounter (Signed)
The application was mailed to the listed address on the patient's profile, if he needs me to send to a different address I can send it there I would just need that address  Christian Harrington

## 2020-12-12 NOTE — Progress Notes (Signed)
Telemedicine Encounter- SOAP NOTE Established Patient MyChart video attempted but not successful Patient: Home  Provider: Office   Patient present only  This telephone encounter was conducted with the patient's (or proxy's) verbal consent via audio telecommunications: yes/no: Yes Patient was instructed to have this encounter in a suitably private space; and to only have persons present to whom they give permission to participate. In addition, patient identity was confirmed by use of name plus two identifiers (DOB and address).  I discussed the limitations, risks, security and privacy concerns of performing an evaluation and management service by telephone and the availability of in person appointments. I also discussed with the patient that there may be a patient responsible charge related to this service. The patient expressed understanding and agreed to proceed.  I spent a total of TIME; 0 MIN TO 60 MIN: 20 minutes talking with the patient or their proxy.  Chief complaint: Diabetic having questions about Ozempic  Subjective   Christian Harrington is a 65 y.o. male established patient. Telephone visit today to discuss Ozempic. Recently seen by me and started on Ozempic but has been having difficulties with insurance. Pharmacist at this office was involved and sent patient the following message: Soundra Pilon,    My name is Linna Hoff, I am a pharmacist that works in the office with Dr. Mitchel Honour, I have sent you a patient assistance application for ozempic that if you meet the requirements of their program you would qualify to receive the medication at no cost.  Please complete the highlighted sections of the application and send back/ drop off back at the office and I can submit the form on your behalf.   Any questions feel free to let me know! Tomasa Blase, PharmD Clinical Pharmacist, [place]     Last read by Louie Casa at 4:53 PM on 12/10/2020.  HPI   Patient Active Problem  List   Diagnosis Date Noted   Dyslipidemia associated with type 2 diabetes mellitus ([place]) 03/29/2015   Hyperlipidemia 03/29/2015   Hypertension associated with diabetes ([place]) 03/29/2015    Past Medical History:  Diagnosis Date   Diabetes mellitus without complication (HCC)     Current Outpatient Medications  Medication Sig Dispense Refill   aspirin 325 MG tablet Take 325 mg by mouth every other day.      blood glucose meter kit and supplies KIT Dispense per patient and insurance preference. Use up to four times daily FOR ICD-9 250.00, 250.01 1 each 0   Cholecalciferol (VITAMIN D3) 400 units CAPS Take 500 Units by mouth.     Cinnamon 500 MG TABS Take 1 tablet by mouth.     clotrimazole-betamethasone (LOTRISONE) cream Apply 1 application topically 2 (two) times daily. 30 g 1   Forskolin POWD by Does not apply route. (Patient not taking: No sig reported)     Garcinia Cambogia-Chromium 500-200 MG-MCG TABS Take 1 tablet by mouth.     glipiZIDE (GLUCOTROL) 5 MG tablet Take 1 tablet (5 mg total) by mouth 2 (two) times daily with a meal. 180 tablet 3   glucose blood test strip Use as instructed 100 each 12   JARDIANCE 10 MG TABS tablet TAKE 1 TABLET BY MOUTH EVERY DAY BEFORE BREAKFAST 90 tablet 0   lovastatin (MEVACOR) 40 MG tablet Take 1 tablet (40 mg total) by mouth daily. 90 tablet 3   metFORMIN (GLUCOPHAGE) 500 MG tablet Take 2 tablets (1,000 mg total) by mouth 2 (two) times  daily with a meal. 360 tablet 1   nabumetone (RELAFEN) 500 MG tablet Take 1 tablet (500 mg total) by mouth 2 (two) times daily as needed. 60 tablet 3   naproxen (NAPROSYN) 500 MG tablet Take 1 tablet (500 mg total) by mouth 2 (two) times daily with a meal. Take only as needed. 60 tablet 1   Omega-3 Fatty Acids (FISH OIL) 1000 MG CAPS Take 1 capsule by mouth.     quinapril (ACCUPRIL) 20 MG tablet Take 1 tablet (20 mg total) by mouth daily. 90 tablet 3   Semaglutide,0.25 or 0.5MG/DOS, (OZEMPIC, 0.25 OR 0.5 MG/DOSE,) 2  MG/1.5ML SOPN Inject 0.5 mg into the skin once a week. 4.5 mL 3   tiZANidine (ZANAFLEX) 2 MG tablet Take 1-2 tablets (2-4 mg total) by mouth every 6 (six) hours as needed for muscle spasms. 60 tablet 1   No current facility-administered medications for this visit.    No Known Allergies  Social History   Socioeconomic History   Marital status: Married    Spouse name: Not on file   Number of children: 4   Years of education: Not on file   Highest education level: Not on file  Occupational History   Occupation: Scientist, product/process development    Comment: [employer]  Tobacco Use   Smoking status: Former    Types: Cigarettes    Quit date: 04/01/1991    Years since quitting: 29.7   Smokeless tobacco: Never  Substance and Sexual Activity   Alcohol use: No    Alcohol/week: 0.0 standard drinks   Drug use: No   Sexual activity: Not on file  Other Topics Concern   Not on file  Social History Narrative   Lives with his wife, who is on disability.   4 adult children live independently, scattered across the country.   Social Determinants of Health   Financial Resource Strain: Not on file  Food Insecurity: Not on file  Transportation Needs: Not on file  Physical Activity: Not on file  Stress: Not on file  Social Connections: Not on file  Intimate Partner Violence: Not on file    Review of Systems  Constitutional: Negative.  Negative for chills and fever.  HENT: Negative.  Negative for congestion and sore throat.   Respiratory:  Negative for cough and shortness of breath.   Cardiovascular:  Negative for chest pain and palpitations.  Gastrointestinal:  Negative for abdominal pain, diarrhea, nausea and vomiting.  Skin: Negative.  Negative for rash.  Neurological:  Negative for dizziness and headaches.  All other systems reviewed and are negative.  Objective  Alert and oriented x3 no apparent respiratory distress. Vitals as reported by the patient: There were no vitals filed for this  visit.  Diagnoses and all orders for this visit:  Hypertension associated with diabetes ([place])  Dyslipidemia associated with type 2 diabetes mellitus ([place])  Worries  Clinically stable.  Medication concerns were addressed. Continue present medications.  No changes. Follow-up with me as already scheduled.  I discussed the assessment and treatment plan with the patient. The patient was provided an opportunity to ask questions and all were answered. The patient agreed with the plan and demonstrated an understanding of the instructions.   The patient was advised to call back or seek an in-person evaluation if the symptoms worsen or if the condition fails to improve as anticipated.  I provided 20 minutes of non-face-to-face time during this encounter.  Horald Pollen, MD  Primary Care at  [place]

## 2020-12-25 ENCOUNTER — Other Ambulatory Visit: Payer: Self-pay | Admitting: Emergency Medicine

## 2020-12-25 DIAGNOSIS — E119 Type 2 diabetes mellitus without complications: Secondary | ICD-10-CM

## 2021-01-28 ENCOUNTER — Other Ambulatory Visit: Payer: Self-pay | Admitting: Emergency Medicine

## 2021-01-28 DIAGNOSIS — E119 Type 2 diabetes mellitus without complications: Secondary | ICD-10-CM

## 2021-05-11 ENCOUNTER — Other Ambulatory Visit: Payer: Self-pay | Admitting: Emergency Medicine

## 2021-06-04 ENCOUNTER — Ambulatory Visit: Payer: Medicare Other | Admitting: Emergency Medicine

## 2021-06-06 ENCOUNTER — Encounter: Payer: Self-pay | Admitting: Emergency Medicine

## 2021-06-06 ENCOUNTER — Other Ambulatory Visit: Payer: Self-pay

## 2021-06-06 ENCOUNTER — Ambulatory Visit (INDEPENDENT_AMBULATORY_CARE_PROVIDER_SITE_OTHER): Payer: Medicare Other | Admitting: Emergency Medicine

## 2021-06-06 VITALS — BP 112/60 | HR 72 | Ht 76.0 in | Wt 235.0 lb

## 2021-06-06 DIAGNOSIS — E1169 Type 2 diabetes mellitus with other specified complication: Secondary | ICD-10-CM | POA: Diagnosis not present

## 2021-06-06 DIAGNOSIS — E785 Hyperlipidemia, unspecified: Secondary | ICD-10-CM | POA: Diagnosis not present

## 2021-06-06 DIAGNOSIS — E1159 Type 2 diabetes mellitus with other circulatory complications: Secondary | ICD-10-CM | POA: Diagnosis not present

## 2021-06-06 DIAGNOSIS — I152 Hypertension secondary to endocrine disorders: Secondary | ICD-10-CM | POA: Diagnosis not present

## 2021-06-06 LAB — LIPID PANEL
Cholesterol: 134 mg/dL (ref 0–200)
HDL: 36.3 mg/dL — ABNORMAL LOW (ref >39.00)
LDL Cholesterol: 77 mg/dL (ref 0–99)
NonHDL: 98.15
Total CHOL/HDL Ratio: 4
Triglycerides: 107 mg/dL (ref 0.0–149.0)
VLDL: 21.4 mg/dL (ref 0.0–40.0)

## 2021-06-06 LAB — COMPREHENSIVE METABOLIC PANEL
ALT: 21 U/L (ref 0–53)
AST: 19 U/L (ref 0–37)
Albumin: 4.6 g/dL (ref 3.5–5.2)
Alkaline Phosphatase: 66 U/L (ref 39–117)
BUN: 10 mg/dL (ref 6–23)
CO2: 29 mEq/L (ref 19–32)
Calcium: 9.9 mg/dL (ref 8.4–10.5)
Chloride: 102 mEq/L (ref 96–112)
Creatinine, Ser: 0.79 mg/dL (ref 0.40–1.50)
GFR: 92.07 mL/min (ref >60.00)
Glucose, Bld: 104 mg/dL — ABNORMAL HIGH (ref 70–99)
Potassium: 3.8 mEq/L (ref 3.5–5.1)
Sodium: 138 mEq/L (ref 135–145)
Total Bilirubin: 0.6 mg/dL (ref 0.2–1.2)
Total Protein: 7.3 g/dL (ref 6.0–8.3)

## 2021-06-06 MED ORDER — GLIPIZIDE 5 MG PO TABS: 5.0000 mg | ORAL_TABLET | Freq: Two times a day (BID) | ORAL | 3 refills | Status: DC

## 2021-06-06 MED ORDER — EMPAGLIFLOZIN 10 MG PO TABS: 10.0000 mg | ORAL_TABLET | Freq: Every day | ORAL | 3 refills | Status: DC

## 2021-06-06 MED ORDER — LOVASTATIN 40 MG PO TABS: 40.0000 mg | ORAL_TABLET | Freq: Every day | ORAL | 3 refills | Status: DC

## 2021-06-06 MED ORDER — METFORMIN HCL 500 MG PO TABS: 1000.0000 mg | ORAL_TABLET | Freq: Two times a day (BID) | ORAL | 1 refills | Status: AC

## 2021-06-06 MED ORDER — QUINAPRIL HCL 20 MG PO TABS: 20.0000 mg | ORAL_TABLET | Freq: Every day | ORAL | 3 refills | Status: DC

## 2021-06-06 NOTE — Assessment & Plan Note (Signed)
Well-controlled hypertension. ?BP Readings from Last 3 Encounters:  ?06/06/21 112/60  ?12/04/20 136/78  ?05/29/20 112/71  ?Continue Accupril 20 mg daily. ?Hemoglobin A1c slightly better than before at 7.6. ?Continue metformin 500 mg twice a day, glipizide 5 mg twice a day, and Jardiance 10 mg daily.  No more groin rashes.  Ozempic was too expensive. ?Diet and nutrition discussed. ?He will follow-up with new PCP. ? ? ?

## 2021-06-06 NOTE — Progress Notes (Signed)
Christian Harrington 66 y.o.   Chief Complaint  Patient presents with   Hypertension    F/u   Diabetes    F/u, med refill    HISTORY OF PRESENT ILLNESS: This is a 66 y.o. male with history of diabetes and hypertension here for follow-up. Recently moved from [place].  He will follow-up with new PCP at his new home town.  Hypertension Pertinent negatives include no chest pain.  Diabetes Pertinent negatives for diabetes include no chest pain.    Prior to Admission medications   Medication Sig Start Date End Date Taking? Authorizing Provider  aspirin 325 MG tablet Take 325 mg by mouth every other day.    Yes [provider]  blood glucose meter kit and supplies KIT Dispense per patient and insurance preference. Use up to four times daily FOR ICD-9 250.00, 250.01 06/11/16  Yes Alveda Reasons, MD  Cholecalciferol (VITAMIN D3) 400 units CAPS Take 500 Units by mouth.   Yes [provider]  Cinnamon 500 MG TABS Take 1 tablet by mouth.   Yes [provider]  clotrimazole-betamethasone (LOTRISONE) cream Apply 1 application topically 2 (two) times daily. 11/01/19  Yes Tyla Burgner, Ines Bloomer, MD  Forskolin POWD by Does not apply route.   Yes [provider]  Garcinia Cambogia-Chromium 500-200 MG-MCG TABS Take 1 tablet by mouth.   Yes [provider]  naproxen (NAPROSYN) 500 MG tablet Take 1 tablet (500 mg total) by mouth 2 (two) times daily with a meal. Take only as needed. 10/13/18  Yes Lautaro Koral, Ines Bloomer, MD  Omega-3 Fatty Acids (FISH OIL) 1000 MG CAPS Take 1 capsule by mouth.   Yes [provider]  Donald Siva test strip USE AS INSTRUCTED 12/25/20  Yes Tiffannie Sloss, Ines Bloomer, MD  empagliflozin (JARDIANCE) 10 MG TABS tablet Take 1 tablet (10 mg total) by mouth daily. 06/06/21 09/04/21  Horald Pollen, MD  glipiZIDE (GLUCOTROL) 5 MG tablet Take 1 tablet (5 mg total) by mouth 2 (two) times daily with a meal. 06/06/21 07/06/21  Hansini Clodfelter, Ines Bloomer, MD  lovastatin (MEVACOR) 40 MG tablet Take 1 tablet (40 mg total) by mouth daily. 06/06/21   Horald Pollen, MD  metFORMIN (GLUCOPHAGE) 500 MG tablet Take 2 tablets (1,000 mg total) by mouth 2 (two) times daily with a meal. 06/06/21   Lakshya Mcgillicuddy, Ines Bloomer, MD  quinapril (ACCUPRIL) 20 MG tablet Take 1 tablet (20 mg total) by mouth daily. 06/06/21   Horald Pollen, MD    No Known Allergies  Patient Active Problem List   Diagnosis Date Noted   Dyslipidemia associated with type 2 diabetes mellitus ([place]) 03/29/2015   Hyperlipidemia 03/29/2015   Hypertension associated with diabetes ([place]) 03/29/2015    Past Medical History:  Diagnosis Date   Diabetes mellitus without complication ([place])     Past Surgical History:  Procedure Laterality Date   CATARACT EXTRACTION Bilateral    SHOULDER ARTHROSCOPY Right 2011   TONSILLECTOMY      Social History   Socioeconomic History   Marital status: Married    Spouse name: Not on file   Number of children: 4   Years of education: Not on file   Highest education level: Not on file  Occupational History   Occupation: Scientist, product/process development    Comment: Production designer, theatre/television/film  Tobacco Use   Smoking status: Former    Types: Cigarettes    Quit date: 04/01/1991    Years since quitting: 30.2   Smokeless  tobacco: Never  Substance and Sexual Activity   Alcohol use: No    Alcohol/week: 0.0 standard drinks   Drug use: No   Sexual activity: Not on file  Other Topics Concern   Not on file  Social History Narrative   Lives with his wife, who is on disability.   4 adult children live independently, scattered across the country.   Social Determinants of Health   Financial Resource Strain: Not on file  Food Insecurity: Not on file  Transportation Needs: Not on file  Physical Activity: Not on file  Stress: Not on file  Social Connections: Not on file  Intimate Partner Violence: Not on file    Family History  Problem Relation Age of Onset    Heart disease Father 39       CHF   Diabetes Sister    Arthritis Mother        OA, DDD   Osteoporosis Mother    Diabetes Maternal Grandmother      Review of Systems  Constitutional: Negative.  Negative for chills and fever.  HENT: Negative.  Negative for congestion and sore throat.   Eyes:  Positive for double vision (Peripheral left eye).  Respiratory: Negative.  Negative for cough.   Cardiovascular: Negative.  Negative for chest pain.  Gastrointestinal:  Negative for nausea and vomiting.  Genitourinary: Negative.  Negative for dysuria.  Skin: Negative.  Negative for rash.  Neurological:        Balance issues  All other systems reviewed and are negative.  Today's Vitals   06/06/21 1442  BP: 112/60  Pulse: 72  SpO2: 93%  Weight: 235 lb (106.6 kg)  Height: _0  (1.93 m)   Body mass index is 28.61 kg/m.  Physical Exam Vitals reviewed.  Constitutional:      Appearance: Normal appearance.  HENT:     Head: Normocephalic.     Mouth/Throat:     Mouth: Mucous membranes are moist.     Pharynx: Oropharynx is clear.  Eyes:     Extraocular Movements: Extraocular movements intact.     Conjunctiva/sclera: Conjunctivae normal.     Pupils: Pupils are equal, round, and reactive to light.     Comments: Slightly diminished peripheral vision on the left eye  Cardiovascular:     Rate and Rhythm: Normal rate and regular rhythm.     Pulses: Normal pulses.     Heart sounds: Normal heart sounds.  Pulmonary:     Effort: Pulmonary effort is normal.     Breath sounds: Normal breath sounds.  Abdominal:     Palpations: Abdomen is soft.     Tenderness: There is no abdominal tenderness.  Musculoskeletal:     Right lower leg: No edema.     Left lower leg: No edema.  Skin:    General: Skin is warm and dry.     Capillary Refill: Capillary refill takes less than 2 seconds.  Neurological:     General: No focal deficit present.     Mental Status: He is alert and oriented to person,  place, and time.     Cranial Nerves: No cranial nerve deficit.     Sensory: No sensory deficit.     Motor: No weakness.     Coordination: Coordination normal.     Gait: Gait normal.  Psychiatric:        Mood and Affect: Mood normal.        Behavior: Behavior normal.     ASSESSMENT & PLAN:  Problem List Items Addressed This Visit       Cardiovascular and Mediastinum   Hypertension associated with diabetes ([place]) - Primary    Well-controlled hypertension. BP Readings from Last 3 Encounters:  06/06/21 112/60  12/04/20 136/78  05/29/20 112/71  Continue Accupril 20 mg daily. Hemoglobin A1c slightly better than before at 7.6. Continue metformin 500 mg twice a day, glipizide 5 mg twice a day, and Jardiance 10 mg daily.  No more groin rashes.  Ozempic was too expensive. Diet and nutrition discussed. He will follow-up with new PCP.        Relevant Medications   quinapril (ACCUPRIL) 20 MG tablet   metFORMIN (GLUCOPHAGE) 500 MG tablet   lovastatin (MEVACOR) 40 MG tablet   empagliflozin (JARDIANCE) 10 MG TABS tablet   glipiZIDE (GLUCOTROL) 5 MG tablet   Other Relevant Orders   POCT glycosylated hemoglobin (Hb A1C)   Comprehensive metabolic panel     Endocrine   Dyslipidemia associated with type 2 diabetes mellitus (HCC)   Relevant Medications   quinapril (ACCUPRIL) 20 MG tablet   metFORMIN (GLUCOPHAGE) 500 MG tablet   lovastatin (MEVACOR) 40 MG tablet   empagliflozin (JARDIANCE) 10 MG TABS tablet   glipiZIDE (GLUCOTROL) 5 MG tablet   Other Relevant Orders   Lipid panel   Patient Instructions  Diabetes Mellitus and Nutrition, Adult When you have diabetes, or diabetes mellitus, it is very important to have healthy eating habits because your blood sugar (glucose) levels are greatly affected by what you eat and drink. Eating healthy foods in the right amounts, at about the same times every day, can help you: Manage your blood glucose. Lower your risk of heart disease. Improve  your blood pressure. Reach or maintain a healthy weight. What can affect my meal plan? Every person with diabetes is different, and each person has different needs for a meal plan. Your health care provider may recommend that you work with a dietitian to make a meal plan that is best for you. Your meal plan may vary depending on factors such as: The calories you need. The medicines you take. Your weight. Your blood glucose, blood pressure, and cholesterol levels. Your activity level. Other health conditions you have, such as heart or kidney disease. How do carbohydrates affect me? Carbohydrates, also called carbs, affect your blood glucose level more than any other type of food. Eating carbs raises the amount of glucose in your blood. It is important to know how many carbs you can safely have in each meal. This is different for every person. Your dietitian can help you calculate how many carbs you should have at each meal and for each snack. How does alcohol affect me? Alcohol can cause a decrease in blood glucose (hypoglycemia), especially if you use insulin or take certain diabetes medicines by mouth. Hypoglycemia can be a life-threatening condition. Symptoms of hypoglycemia, such as sleepiness, dizziness, and confusion, are similar to symptoms of having too much alcohol. Do not drink alcohol if: Your health care provider tells you not to drink. You are pregnant, may be pregnant, or are planning to become pregnant. If you drink alcohol: Limit how much you have to: 0-1 drink a day for women. 0-2 drinks a day for men. Know how much alcohol is in your drink. In the [place] one drink equals one 12 oz bottle of beer (355 mL), one 5 oz glass of wine (148 mL), or one 1 oz glass of hard liquor (44 mL). Keep yourself hydrated with  water, diet soda, or unsweetened iced tea. Keep in mind that regular soda, juice, and other mixers may contain a lot of sugar and must be counted as carbs. What are tips  for following this plan? Reading food labels Start by checking the serving size on the Nutrition Facts label of packaged foods and drinks. The number of calories and the amount of carbs, fats, and other nutrients listed on the label are based on one serving of the item. Many items contain more than one serving per package. Check the total grams (g) of carbs in one serving. Check the number of grams of saturated fats and trans fats in one serving. Choose foods that have a low amount or none of these fats. Check the number of milligrams (mg) of salt (sodium) in one serving. Most people should limit total sodium intake to less than 2,300 mg per day. Always check the nutrition information of foods labeled as "low-fat" or "nonfat." These foods may be higher in added sugar or refined carbs and should be avoided. Talk to your dietitian to identify your daily goals for nutrients listed on the label. Shopping Avoid buying canned, pre-made, or processed foods. These foods tend to be high in fat, sodium, and added sugar. Shop around the outside edge of the grocery store. This is where you will most often find fresh fruits and vegetables, bulk grains, fresh meats, and fresh dairy products. Cooking Use low-heat cooking methods, such as baking, instead of high-heat cooking methods, such as deep frying. Cook using healthy oils, such as olive, canola, or sunflower oil. Avoid cooking with butter, cream, or high-fat meats. Meal planning Eat meals and snacks regularly, preferably at the same times every day. Avoid going long periods of time without eating. Eat foods that are high in fiber, such as fresh fruits, vegetables, beans, and whole grains. Eat 4-6 oz (112-168 g) of lean protein each day, such as lean meat, chicken, fish, eggs, or tofu. One ounce (oz) (28 g) of lean protein is equal to: 1 oz (28 g) of meat, chicken, or fish. 1 egg.  cup (62 g) of tofu. Eat some foods each day that contain healthy fats,  such as avocado, nuts, seeds, and fish. What foods should I eat? Fruits Berries. Apples. Oranges. Peaches. Apricots. Plums. Grapes. Mangoes. Papayas. Pomegranates. Kiwi. Cherries. Vegetables Leafy greens, including lettuce, spinach, kale, chard, collard greens, mustard greens, and cabbage. Beets. Cauliflower. Broccoli. Carrots. Green beans. Tomatoes. Peppers. Onions. Cucumbers. Brussels sprouts. Grains Whole grains, such as whole-wheat or whole-grain bread, crackers, tortillas, cereal, and pasta. Unsweetened oatmeal. Quinoa. Brown or wild rice. Meats and other proteins Seafood. Poultry without skin. Lean cuts of poultry and beef. Tofu. Nuts. Seeds. Dairy Low-fat or fat-free dairy products such as milk, yogurt, and cheese. The items listed above may not be a complete list of foods and beverages you can eat and drink. Contact a dietitian for more information. What foods should I avoid? Fruits Fruits canned with syrup. Vegetables Canned vegetables. Frozen vegetables with butter or cream sauce. Grains Refined white flour and flour products such as bread, pasta, snack foods, and cereals. Avoid all processed foods. Meats and other proteins Fatty cuts of meat. Poultry with skin. Breaded or fried meats. Processed meat. Avoid saturated fats. Dairy Full-fat yogurt, cheese, or milk. Beverages Sweetened drinks, such as soda or iced tea. The items listed above may not be a complete list of foods and beverages you should avoid. Contact a dietitian for more information. Questions to ask  a health care provider Do I need to meet with a certified diabetes care and education specialist? Do I need to meet with a dietitian? What number can I call if I have questions? When are the best times to check my blood glucose? Where to find more information: American Diabetes Association: diabetes.org Academy of Nutrition and Dietetics: eatright.[employer] of Diabetes and Digestive and Kidney  Diseases: [employer]is Association of Diabetes Care & Education Specialists: diabeteseducator.org Summary It is important to have healthy eating habits because your blood sugar (glucose) levels are greatly affected by what you eat and drink. It is important to use alcohol carefully. A healthy meal plan will help you manage your blood glucose and lower your risk of heart disease. Your health care provider may recommend that you work with a dietitian to make a meal plan that is best for you. This information is not intended to replace advice given to you by your health care provider. Make sure you discuss any questions you have with your health care provider. Document Revised: 10/19/2019 Document Reviewed: 10/19/2019 Elsevier Patient Education  2022 [place], MD [facility]

## 2021-06-06 NOTE — Patient Instructions (Signed)

## 2021-06-07 LAB — POCT GLYCOSYLATED HEMOGLOBIN (HGB A1C): Hemoglobin A1C: 7.6 % — AB (ref 4.0–5.6)

## 2021-06-23 ENCOUNTER — Other Ambulatory Visit: Payer: Self-pay | Admitting: Emergency Medicine

## 2021-06-23 DIAGNOSIS — E1159 Type 2 diabetes mellitus with other circulatory complications: Secondary | ICD-10-CM

## 2021-06-25 ENCOUNTER — Other Ambulatory Visit: Payer: Self-pay | Admitting: Emergency Medicine

## 2021-06-25 DIAGNOSIS — E1159 Type 2 diabetes mellitus with other circulatory complications: Secondary | ICD-10-CM

## 2021-06-28 ENCOUNTER — Other Ambulatory Visit: Payer: Self-pay | Admitting: Emergency Medicine

## 2021-06-28 DIAGNOSIS — I152 Hypertension secondary to endocrine disorders: Secondary | ICD-10-CM

## 2021-07-01 ENCOUNTER — Other Ambulatory Visit: Payer: Self-pay | Admitting: Emergency Medicine

## 2021-07-01 MED ORDER — LISINOPRIL 20 MG PO TABS: 20.0000 mg | ORAL_TABLET | Freq: Every day | ORAL | 3 refills | Status: DC

## 2021-07-01 NOTE — Telephone Encounter (Signed)
New prescription for lisinopril 20 mg daily sent to pharmacy of record.

## 2021-08-08 ENCOUNTER — Other Ambulatory Visit: Payer: Self-pay | Admitting: Emergency Medicine

## 2021-08-08 DIAGNOSIS — E1159 Type 2 diabetes mellitus with other circulatory complications: Secondary | ICD-10-CM

## 2022-02-11 ENCOUNTER — Telehealth: Payer: Self-pay | Admitting: Emergency Medicine

## 2022-02-11 NOTE — Telephone Encounter (Signed)
Left message for patient to call back to schedule Medicare Annual Wellness Visit   No hx of AWV eligible as of 01/30/20  Please schedule at anytime with [facility] Advisor if patient calls the office back.     Any questions, please call me at [phone]

## 2022-03-12 ENCOUNTER — Other Ambulatory Visit: Payer: Self-pay | Admitting: Emergency Medicine

## 2022-03-12 DIAGNOSIS — I152 Hypertension secondary to endocrine disorders: Secondary | ICD-10-CM

## 2022-03-12 DIAGNOSIS — E1169 Type 2 diabetes mellitus with other specified complication: Secondary | ICD-10-CM

## 2022-05-28 ENCOUNTER — Other Ambulatory Visit: Payer: Self-pay | Admitting: Emergency Medicine

## 2022-06-07 ENCOUNTER — Other Ambulatory Visit: Payer: Self-pay | Admitting: Emergency Medicine

## 2022-06-07 DIAGNOSIS — E1159 Type 2 diabetes mellitus with other circulatory complications: Secondary | ICD-10-CM

## 2022-12-08 ENCOUNTER — Other Ambulatory Visit: Payer: Self-pay | Admitting: Emergency Medicine

## 2022-12-08 DIAGNOSIS — E1159 Type 2 diabetes mellitus with other circulatory complications: Secondary | ICD-10-CM

## 2022-12-08 DIAGNOSIS — E1169 Type 2 diabetes mellitus with other specified complication: Secondary | ICD-10-CM

## 2023-05-11 ENCOUNTER — Other Ambulatory Visit: Payer: Self-pay | Admitting: Emergency Medicine

## 2023-10-08 ENCOUNTER — Other Ambulatory Visit: Payer: Self-pay | Admitting: Emergency Medicine

## 2023-10-08 DIAGNOSIS — E1159 Type 2 diabetes mellitus with other circulatory complications: Secondary | ICD-10-CM

## 2023-10-09 NOTE — Telephone Encounter (Signed)
 Called numbers on file and mobile phone may incorrect, second number on file not able to LVM

## 2023-11-03 ENCOUNTER — Other Ambulatory Visit: Payer: Self-pay | Admitting: Emergency Medicine

## 2023-11-03 DIAGNOSIS — E1159 Type 2 diabetes mellitus with other circulatory complications: Secondary | ICD-10-CM

## 2024-01-25 ENCOUNTER — Other Ambulatory Visit: Payer: Self-pay | Admitting: Emergency Medicine

## 2024-01-25 DIAGNOSIS — E1169 Type 2 diabetes mellitus with other specified complication: Secondary | ICD-10-CM

## 2024-03-31 DEATH — deceased
